# Patient Record
Sex: Female | Born: 1985 | Race: White | Hispanic: No | Marital: Single | Smoking: Former smoker
Health system: Southern US, Community
[De-identification: ages and names within clinical notes are randomized; demographics above are authoritative.]

## PROBLEM LIST (undated history)

## (undated) DIAGNOSIS — M5090 Cervical disc disorder, unspecified, unspecified cervical region: Secondary | ICD-10-CM

## (undated) DIAGNOSIS — G43909 Migraine, unspecified, not intractable, without status migrainosus: Secondary | ICD-10-CM

## (undated) HISTORY — DX: Cervical disc disorder, unspecified, unspecified cervical region: M50.90

## (undated) HISTORY — DX: Migraine, unspecified, not intractable, without status migrainosus: G43.909

## (undated) HISTORY — PX: BACK SURGERY: SHX140

---

## 2005-04-23 ENCOUNTER — Emergency Department: Payer: Self-pay | Admitting: Emergency Medicine

## 2005-08-07 ENCOUNTER — Emergency Department: Payer: Self-pay | Admitting: General Practice

## 2005-09-21 ENCOUNTER — Emergency Department: Payer: Self-pay | Admitting: Unknown Physician Specialty

## 2005-11-26 ENCOUNTER — Emergency Department: Payer: Self-pay | Admitting: Unknown Physician Specialty

## 2005-12-07 ENCOUNTER — Emergency Department: Payer: Self-pay | Admitting: Emergency Medicine

## 2006-03-13 ENCOUNTER — Emergency Department: Payer: Self-pay | Admitting: Emergency Medicine

## 2006-06-06 ENCOUNTER — Emergency Department: Payer: Self-pay | Admitting: Emergency Medicine

## 2006-07-31 ENCOUNTER — Emergency Department: Payer: Self-pay | Admitting: Emergency Medicine

## 2006-09-09 ENCOUNTER — Emergency Department: Payer: Self-pay | Admitting: Emergency Medicine

## 2007-04-17 ENCOUNTER — Emergency Department: Payer: Self-pay | Admitting: Emergency Medicine

## 2007-05-09 ENCOUNTER — Emergency Department: Payer: Self-pay | Admitting: Emergency Medicine

## 2007-05-30 ENCOUNTER — Emergency Department: Payer: Self-pay | Admitting: Emergency Medicine

## 2007-07-06 ENCOUNTER — Emergency Department: Payer: Self-pay | Admitting: Emergency Medicine

## 2007-07-30 ENCOUNTER — Emergency Department: Payer: Self-pay | Admitting: Emergency Medicine

## 2007-10-17 ENCOUNTER — Emergency Department: Payer: Self-pay

## 2007-11-11 ENCOUNTER — Emergency Department: Payer: Self-pay | Admitting: Internal Medicine

## 2008-01-09 ENCOUNTER — Emergency Department: Payer: Self-pay | Admitting: Emergency Medicine

## 2008-02-10 ENCOUNTER — Emergency Department: Payer: Self-pay | Admitting: Emergency Medicine

## 2008-02-16 ENCOUNTER — Emergency Department: Payer: Self-pay | Admitting: Emergency Medicine

## 2008-03-23 ENCOUNTER — Emergency Department: Payer: Self-pay | Admitting: Emergency Medicine

## 2008-03-27 ENCOUNTER — Emergency Department: Payer: Self-pay | Admitting: Emergency Medicine

## 2008-04-29 ENCOUNTER — Emergency Department: Payer: Self-pay | Admitting: Internal Medicine

## 2008-07-20 ENCOUNTER — Emergency Department: Payer: Self-pay | Admitting: Internal Medicine

## 2008-09-15 ENCOUNTER — Emergency Department: Payer: Self-pay | Admitting: Unknown Physician Specialty

## 2008-11-02 ENCOUNTER — Emergency Department: Payer: Self-pay | Admitting: Unknown Physician Specialty

## 2008-12-15 ENCOUNTER — Emergency Department: Payer: Self-pay | Admitting: Emergency Medicine

## 2009-02-11 ENCOUNTER — Emergency Department: Payer: Self-pay | Admitting: Emergency Medicine

## 2009-03-14 IMAGING — CR RIGHT FOOT COMPLETE - 3+ VIEW
1 series · 3 of 3 positions shown · non-contrast
Comparison: None

REASON FOR EXAM: rt foot pain/injury
COMMENTS:

PROCEDURE:     DXR - DXR FOOT RT COMPLETE W/OBLIQUES  - January 09, 2008 [DATE]
RESULT:     History: Pain

[Series 1: view not recorded · 0.17mm/px · 3 of 3 slices shown]
[im 1/3]
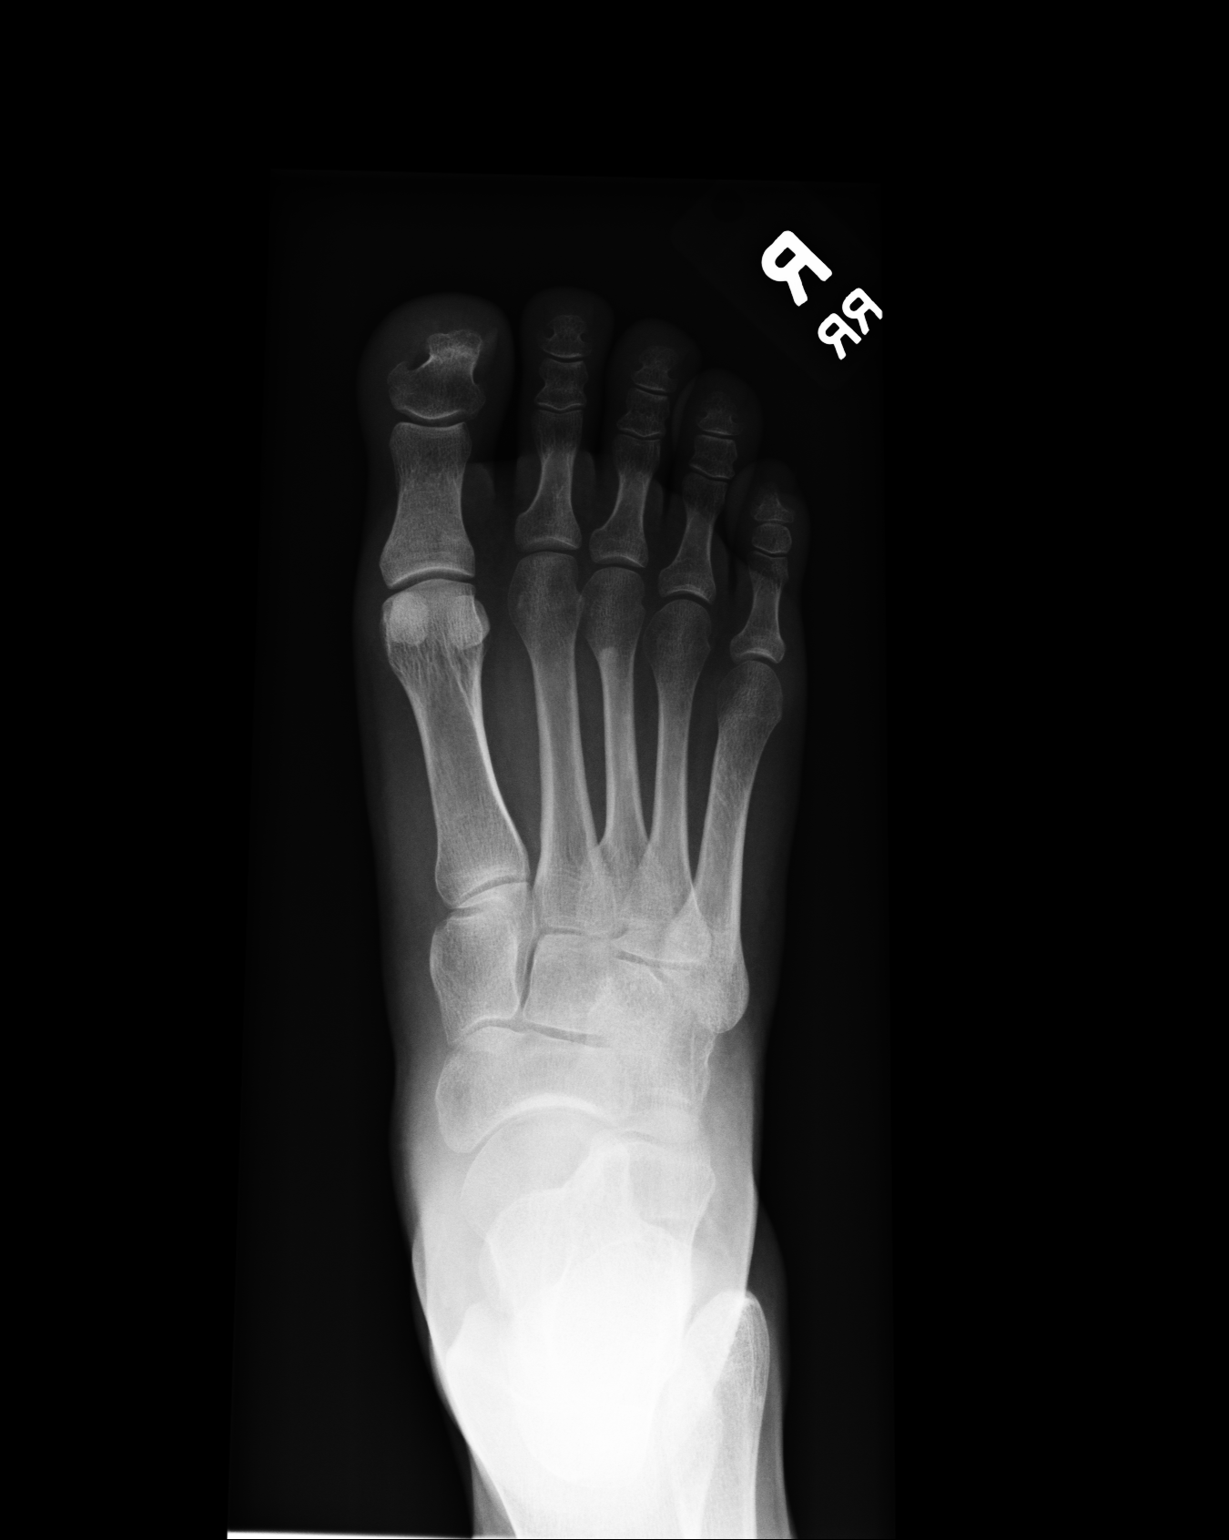
[im 2/3]
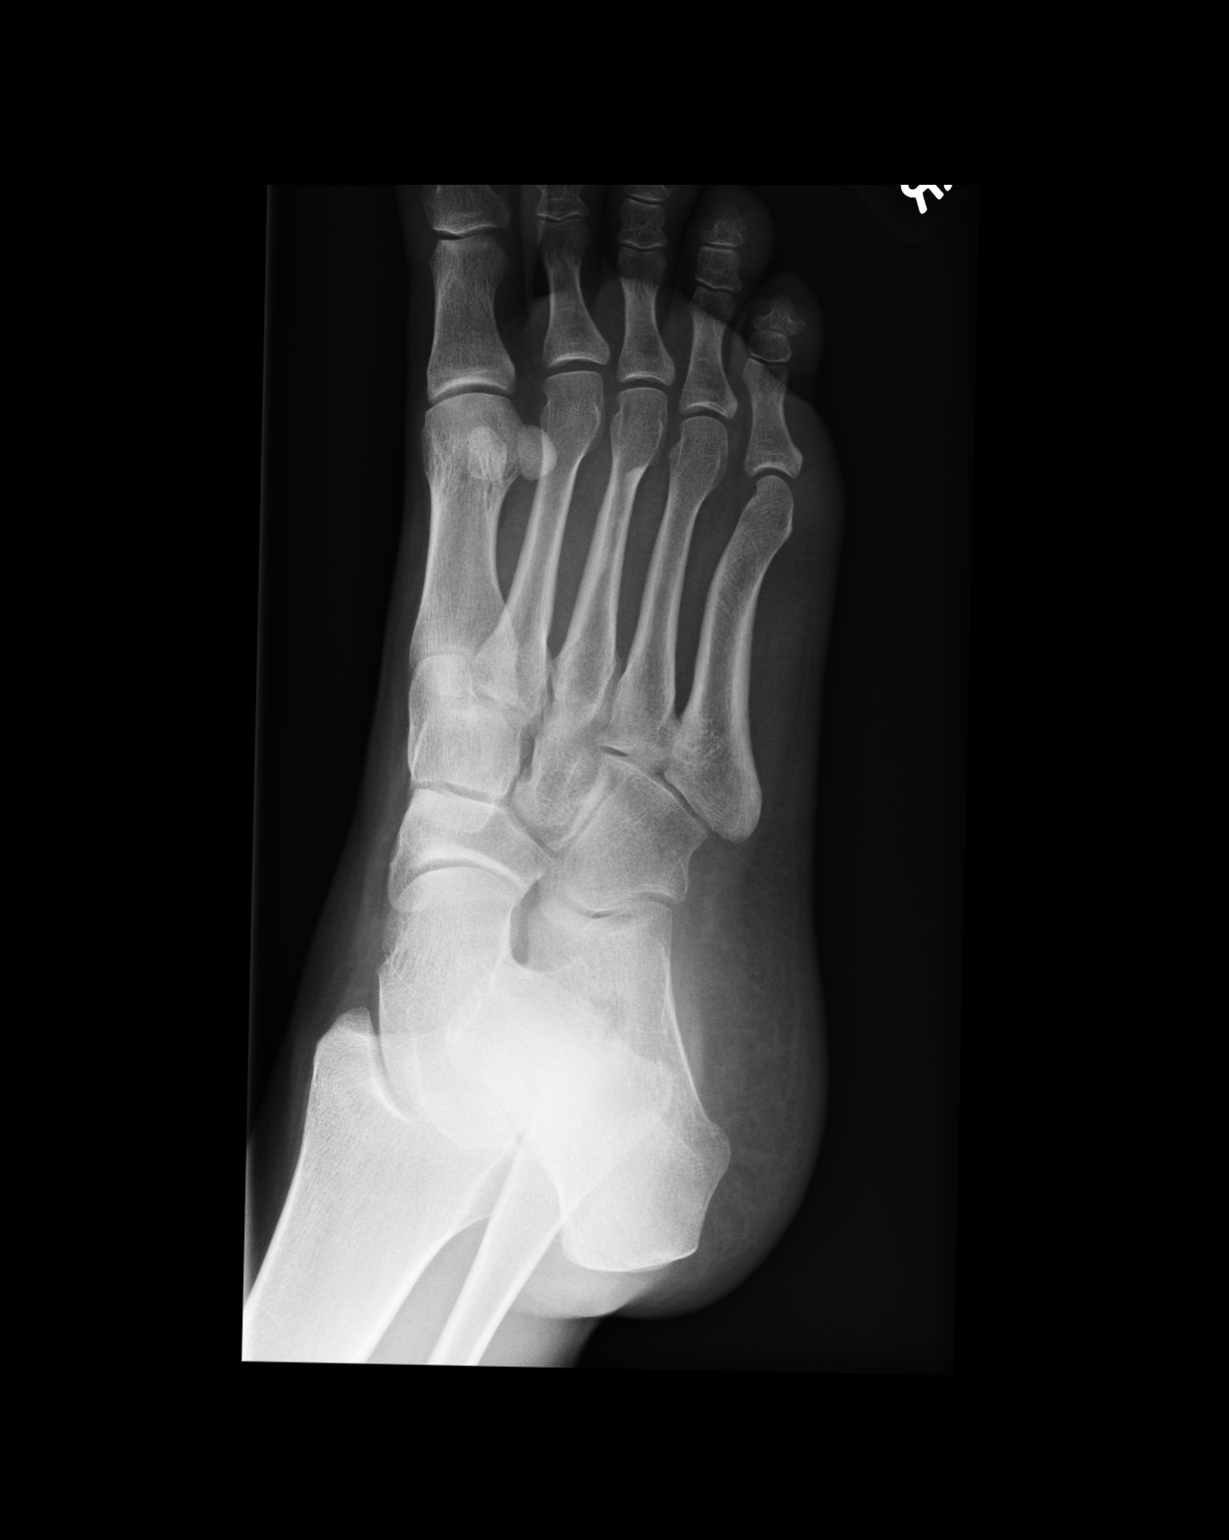
[im 3/3]
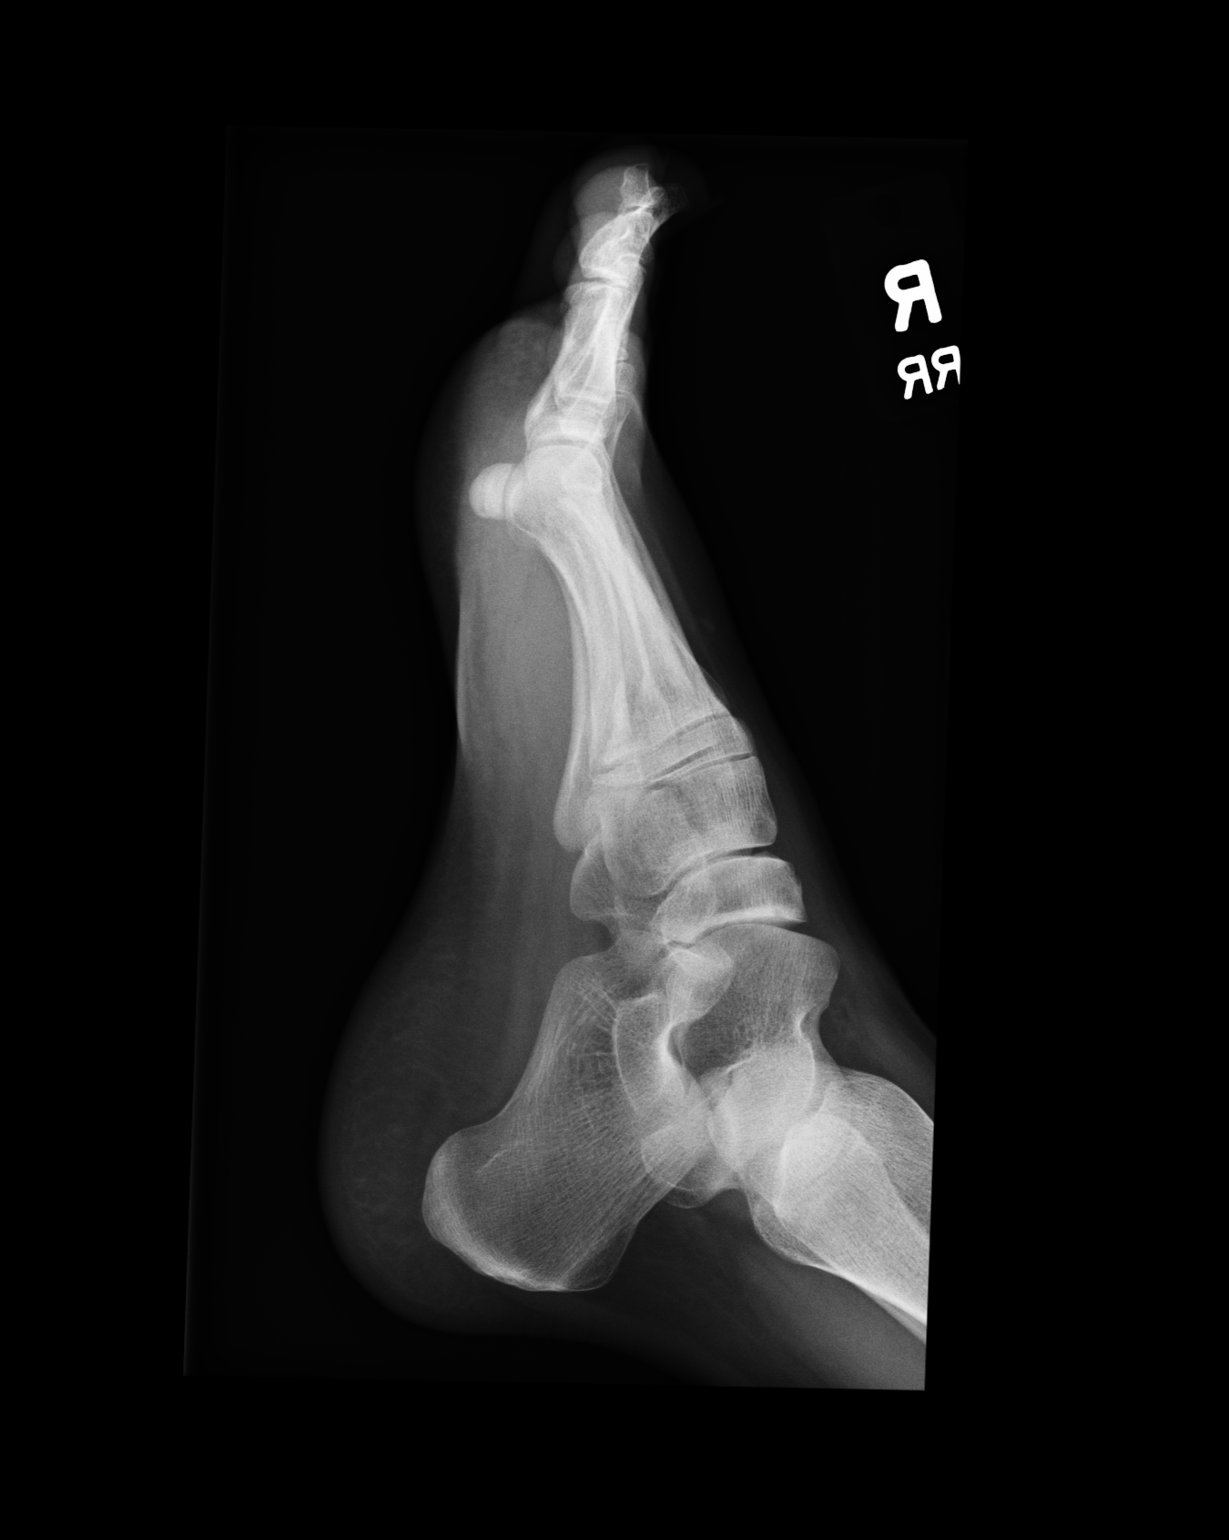

[3 of 3 positions shown; findings below may reference images not displayed]

FINDINGS: AP, oblique, and lateral views of the right foot demonstrate no fracture or
dislocation. There is no soft tissue abnormality. There is no subcutaneous
emphysema or radiopaque foreign bodies.
IMPRESSION: No acute osseous injury of the right foot.

## 2009-03-16 ENCOUNTER — Emergency Department: Payer: Self-pay | Admitting: Emergency Medicine

## 2009-04-27 ENCOUNTER — Emergency Department: Payer: Self-pay | Admitting: Internal Medicine

## 2009-05-30 ENCOUNTER — Emergency Department: Payer: Self-pay | Admitting: Emergency Medicine

## 2009-09-28 ENCOUNTER — Emergency Department: Payer: Self-pay | Admitting: Internal Medicine

## 2009-10-23 ENCOUNTER — Emergency Department: Payer: Self-pay | Admitting: Emergency Medicine

## 2009-11-11 ENCOUNTER — Emergency Department: Payer: Self-pay | Admitting: Emergency Medicine

## 2009-12-13 ENCOUNTER — Emergency Department: Payer: Self-pay | Admitting: Emergency Medicine

## 2010-04-04 ENCOUNTER — Emergency Department: Payer: Self-pay | Admitting: Emergency Medicine

## 2010-05-19 ENCOUNTER — Emergency Department: Payer: Self-pay | Admitting: Emergency Medicine

## 2011-03-01 ENCOUNTER — Emergency Department: Payer: Self-pay | Admitting: Emergency Medicine

## 2011-09-21 ENCOUNTER — Emergency Department: Payer: Self-pay | Admitting: Emergency Medicine

## 2012-04-22 ENCOUNTER — Emergency Department: Payer: Self-pay

## 2012-04-24 LAB — BETA STREP CULTURE(ARMC)

## 2012-11-25 ENCOUNTER — Emergency Department: Payer: Self-pay | Admitting: Emergency Medicine

## 2013-03-05 ENCOUNTER — Emergency Department: Payer: Self-pay | Admitting: Emergency Medicine

## 2013-07-21 ENCOUNTER — Emergency Department: Payer: Self-pay | Admitting: Emergency Medicine

## 2014-01-23 ENCOUNTER — Emergency Department: Payer: Self-pay | Admitting: Student

## 2014-05-05 ENCOUNTER — Emergency Department: Payer: Self-pay | Admitting: Emergency Medicine

## 2014-10-09 ENCOUNTER — Encounter: Payer: Self-pay | Admitting: Emergency Medicine

## 2014-10-09 ENCOUNTER — Emergency Department
Admission: EM | Admit: 2014-10-09 | Discharge: 2014-10-09 | Disposition: A | Discharge status: Home / Self Care | Payer: BLUE CROSS/BLUE SHIELD | Attending: Emergency Medicine | Admitting: Emergency Medicine

## 2014-10-09 ENCOUNTER — Emergency Department: Payer: BLUE CROSS/BLUE SHIELD

## 2014-10-09 DIAGNOSIS — Y9389 Activity, other specified: Secondary | ICD-10-CM | POA: Insufficient documentation

## 2014-10-09 DIAGNOSIS — Y9289 Other specified places as the place of occurrence of the external cause: Secondary | ICD-10-CM | POA: Diagnosis not present

## 2014-10-09 DIAGNOSIS — Y998 Other external cause status: Secondary | ICD-10-CM | POA: Diagnosis not present

## 2014-10-09 DIAGNOSIS — S86811A Strain of other muscle(s) and tendon(s) at lower leg level, right leg, initial encounter: Secondary | ICD-10-CM | POA: Diagnosis not present

## 2014-10-09 DIAGNOSIS — S86911A Strain of unspecified muscle(s) and tendon(s) at lower leg level, right leg, initial encounter: Secondary | ICD-10-CM

## 2014-10-09 DIAGNOSIS — W4909XA Other specified item causing external constriction, initial encounter: Secondary | ICD-10-CM | POA: Diagnosis not present

## 2014-10-09 DIAGNOSIS — S80911A Unspecified superficial injury of right knee, initial encounter: Secondary | ICD-10-CM | POA: Diagnosis present

## 2014-10-09 MED ORDER — KETOROLAC TROMETHAMINE 60 MG/2ML IM SOLN
60.0000 mg | Freq: Once | INTRAMUSCULAR | Status: AC
  Administered 2014-10-09: 60 mg via INTRAMUSCULAR
  Filled 2014-10-09: qty 2

## 2014-10-09 MED ORDER — IBUPROFEN 800 MG PO TABS: 800.0000 mg | ORAL_TABLET | Freq: Three times a day (TID) | ORAL | Status: DC | PRN

## 2014-10-09 MED ORDER — HYDROCODONE-ACETAMINOPHEN 5-325 MG PO TABS: 1.0000 | ORAL_TABLET | ORAL | Status: DC | PRN

## 2014-10-09 MED ORDER — PREDNISONE 10 MG PO TABS: 50.0000 mg | ORAL_TABLET | Freq: Every day | ORAL | Status: DC

## 2014-10-09 NOTE — ED Provider Notes (Signed)
[facility] Emergency Department Provider Note  ____________________________________________  Time seen: Approximately 12:18 PM  I have reviewed the triage vital signs and the nursing notes.   HISTORY  Chief Complaint Knee Pain    HPI Rose Ray is a 29 y.o. 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 Denies any specific trauma to her knee.   History reviewed. No pertinent past medical history.  There are no active problems to display for this patient.   History reviewed. No pertinent past surgical history.  Current Outpatient Rx  Name  Route  Sig  Dispense  Refill  . HYDROcodone-acetaminophen (NORCO) 5-325 MG per tablet   Oral   Take 1-2 tablets by mouth every 4 (four) hours as needed for moderate pain.   8 tablet   0   . ibuprofen (ADVIL,MOTRIN) 800 MG tablet   Oral   Take 1 tablet (800 mg total) by mouth every 8 (eight) hours as needed.   30 tablet   0   . predniSONE (DELTASONE) 10 MG tablet   Oral   Take 5 tablets (50 mg total) by mouth daily with breakfast.   25 tablet   0     Allergies Review of patient's allergies indicates no known allergies.  History reviewed. No pertinent family history.  Social History Social History  Substance Use Topics  . Smoking status: Never Smoker   . Smokeless tobacco: None  . Alcohol Use: No    Review of Systems Constitutional: No fever/chills Eyes: No visual changes. ENT: No sore throat. Cardiovascular: Denies chest pain. Respiratory: Denies shortness of breath. Gastrointestinal: No abdominal pain.  No nausea, no vomiting.  No diarrhea.  No constipation. Genitourinary: Negative for dysuria. Musculoskeletal: Positive for right knee pain with no known trauma. Skin: Negative for rash. Neurological: Negative for headaches, focal weakness or numbness.  10-point ROS otherwise  negative.  ____________________________________________   PHYSICAL EXAM:  VITAL SIGNS: ED Triage Vitals  Enc Vitals Group     BP 10/09/14 1152 120/79 mmHg     Pulse Rate 10/09/14 1152 72     Resp 10/09/14 1152 20     Temp 10/09/14 1152 98 F (36.7 C)     Temp Source 10/09/14 1152 Oral     SpO2 10/09/14 1152 97 %     Weight 10/09/14 1152 155 lb (70.308 kg)     Height 10/09/14 1152 5\' 5"  (1.651 m)     Head Cir --      Peak Flow --      Pain Score 10/09/14 1155 10     Pain Loc --      Pain Edu? --      Excl. in GC? --     Constitutional: Alert and oriented. Well appearing and in no acute distress. Musculoskeletal: Positive right knee pain with no joint effusion noted. Increased pain with flexion and extension. Negative for valgus stress test. Neurologic:  Normal speech and language. No gross focal neurologic deficits are appreciated. No gait instability. Skin:  Skin is warm, dry and intact. No rash noted. Psychiatric: Mood and affect are normal. Speech and behavior are normal.  ____________________________________________   LABS (all labs ordered are listed, but only abnormal results are displayed)  Labs Reviewed - No data to display ____________________________________________ RADIOLOGY  Right knee x-ray interpreted by radiologist reviewed by myself as negative. ____________________________________________   PROCEDURES  Procedure(s)  performed: None  Critical Care performed: No  ____________________________________________   INITIAL IMPRESSION / ASSESSMENT AND PLAN / ED COURSE  Pertinent labs & imaging results that were available during my care of the patient were reviewed by me and considered in my medical decision making (see chart for details).  Right knee strain. 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 Reassurance provided encouraged patient to find some salty source. Rx given for Motrin 800 mg 3 times a day #30, prednisone 50 mg daily 5 days and Norco  06/28/2023 number 8 provided as well. Patient to follow-up with orthopedics if continued pain. She voices no other emergency medical complaints at this time. ____________________________________________   FINAL CLINICAL IMPRESSION(S) / ED DIAGNOSES  Final diagnoses:  Knee strain, right, initial encounter      Evangeline Dakin, PA-C 10/09/14 1324  Loleta Rose, MD 10/09/14 1715

## 2014-10-09 NOTE — ED Notes (Signed)
Discussed prescriptions and discharge instructions with the patient and all questions were fully answered.

## 2014-10-09 NOTE — ED Notes (Signed)
Patient to ED with c/o left knee pain x1 week, denies any known injury.

## 2014-10-09 NOTE — Discharge Instructions (Signed)
Joint Sprain A sprain is a tear or stretch in the ligaments that hold a joint together. Severe sprains may need as long as 3-6 weeks of immobilization and/or exercises to heal completely. Sprained joints should be rested and protected. If not, they can become unstable and prone to re-injury. Proper treatment can reduce your pain, shorten the period of disability, and reduce the risk of repeated injuries. TREATMENT   Rest and elevate the injured joint to reduce pain and swelling.  Apply ice packs to the injury for 20-30 minutes every 2-3 hours for the next 2-3 days.  Keep the injury wrapped in a compression bandage or splint as long as the joint is painful or as instructed by your caregiver.  Do not use the injured joint until it is completely healed to prevent re-injury and chronic instability. Follow the instructions of your caregiver.  Long-term sprain management may require exercises and/or treatment by a physical therapist. Taping or special braces may help stabilize the joint until it is completely better. SEEK MEDICAL CARE IF:   You develop increased pain or swelling of the joint.  You develop increasing redness and warmth of the joint.  You develop a fever.  It becomes stiff.  Your hand or foot gets cold or numb. Document Released: 03/21/2004 Document Revised: 05/06/2011 Document Reviewed: 02/29/2008 [facility] Patient Information 2015 [place], [place]. This information is not intended to replace advice given to you by your health care provider. Make sure you discuss any questions you have with your health care provider.  Knee Pain The knee is the complex joint between your thigh and your lower leg. It is made up of bones, tendons, ligaments, and cartilage. The bones that make up the knee are:  The femur in the thigh.  The tibia and fibula in the lower leg.  The patella or kneecap riding in the groove on the lower femur. CAUSES  Knee pain is a common complaint with many  causes. A few of these causes are:  Injury, such as:  A ruptured ligament or tendon injury.  Torn cartilage.  Medical conditions, such as:  Gout  Arthritis  Infections  Overuse, over training, or overdoing a physical activity. Knee pain can be minor or severe. Knee pain can accompany debilitating injury. Minor knee problems often respond well to self-care measures or get well on their own. More serious injuries may need medical intervention or even surgery. SYMPTOMS The knee is complex. Symptoms of knee problems can vary widely. Some of the problems are:  Pain with movement and weight bearing.  Swelling and tenderness.  Buckling of the knee.  Inability to straighten or extend your knee.  Your knee locks and you cannot straighten it.  Warmth and redness with pain and fever.  Deformity or dislocation of the kneecap. DIAGNOSIS  Determining what is wrong may be very straight forward such as when there is an injury. It can also be challenging because of the complexity of the knee. Tests to make a diagnosis may include:  Your caregiver taking a history and doing a physical exam.  Routine X-rays can be used to rule out other problems. X-rays will not reveal a cartilage tear. Some injuries of the knee can be diagnosed by:  Arthroscopy a surgical technique by which a small video camera is inserted through tiny incisions on the sides of the knee. This procedure is used to examine and repair internal knee joint problems. Tiny instruments can be used during arthroscopy to repair the torn knee  cartilage (meniscus).  Arthrography is a radiology technique. A contrast liquid is directly injected into the knee joint. Internal structures of the knee joint then become visible on X-ray film.  An MRI scan is a non X-ray radiology procedure in which magnetic fields and a computer produce two- or three-dimensional images of the inside of the knee. Cartilage tears are often visible using an MRI  scanner. MRI scans have largely replaced arthrography in diagnosing cartilage tears of the knee.  Blood work.  Examination of the fluid that helps to lubricate the knee joint (synovial fluid). This is done by taking a sample out using a needle and a syringe. TREATMENT The treatment of knee problems depends on the cause. Some of these treatments are:  Depending on the injury, proper casting, splinting, surgery, or physical therapy care will be needed.  Give yourself adequate recovery time. Do not overuse your joints. If you begin to get sore during workout routines, back off. Slow down or do fewer repetitions.  For repetitive activities such as cycling or running, maintain your strength and nutrition.  Alternate muscle groups. For example, if you are a weight lifter, work the upper body on one day and the lower body the next.  Either tight or weak muscles do not give the proper support for your knee. Tight or weak muscles do not absorb the stress placed on the knee joint. Keep the muscles surrounding the knee strong.  Take care of mechanical problems.  If you have flat feet, orthotics or special shoes may help. See your caregiver if you need help.  Arch supports, sometimes with wedges on the inner or outer aspect of the heel, can help. These can shift pressure away from the side of the knee most bothered by osteoarthritis.  A brace called an "unloader" brace also may be used to help ease the pressure on the most arthritic side of the knee.  If your caregiver has prescribed crutches, braces, wraps or ice, use as directed. The acronym for this is PRICE. This means protection, rest, ice, compression, and elevation.  Nonsteroidal anti-inflammatory drugs (NSAIDs), can help relieve pain. But if taken immediately after an injury, they may actually increase swelling. Take NSAIDs with food in your stomach. Stop them if you develop stomach problems. Do not take these if you have a history of ulcers,  stomach pain, or bleeding from the bowel. Do not take without your caregiver's approval if you have problems with fluid retention, heart failure, or kidney problems.  For ongoing knee problems, physical therapy may be helpful.  Glucosamine and chondroitin are over-the-counter dietary supplements. Both may help relieve the pain of osteoarthritis in the knee. These medicines are different from the usual anti-inflammatory drugs. Glucosamine may decrease the rate of cartilage destruction.  Injections of a corticosteroid drug into your knee joint may help reduce the symptoms of an arthritis flare-up. They may provide pain relief that lasts a few months. You may have to wait a few months between injections. The injections do have a small increased risk of infection, water retention, and elevated blood sugar levels.  Hyaluronic acid injected into damaged joints may ease pain and provide lubrication. These injections may work by reducing inflammation. A series of shots may give relief for as long as 6 months.  Topical painkillers. Applying certain ointments to your skin may help relieve the pain and stiffness of osteoarthritis. Ask your pharmacist for suggestions. Many over the-counter products are approved for temporary relief of arthritis pain.  In  some countries, doctors often prescribe topical NSAIDs for relief of chronic conditions such as arthritis and tendinitis. A review of treatment with NSAID creams found that they worked as well as oral medications but without the serious side effects. PREVENTION  Maintain a healthy weight. Extra pounds put more strain on your joints.  Get strong, stay limber. Weak muscles are a common cause of knee injuries. Stretching is important. Include flexibility exercises in your workouts.  Be smart about exercise. If you have osteoarthritis, chronic knee pain or recurring injuries, you may need to change the way you exercise. This does not mean you have to stop being  active. If your knees ache after jogging or playing basketball, consider switching to swimming, water aerobics, or other low-impact activities, at least for a few days a week. Sometimes limiting high-impact activities will provide relief.  Make sure your shoes fit well. Choose footwear that is right for your sport.  Protect your knees. Use the proper gear for knee-sensitive activities. Use kneepads when playing volleyball or laying carpet. Buckle your seat belt every time you drive. Most shattered kneecaps occur in car accidents.  Rest when you are tired. SEEK MEDICAL CARE IF:  You have knee pain that is continual and does not seem to be getting better.  SEEK IMMEDIATE MEDICAL CARE IF:  Your knee joint feels hot to the touch and you have a high fever. MAKE SURE YOU:   Understand these instructions.  Will watch your condition.  Will get help right away if you are not doing well or get worse. Document Released: 12/09/2006 Document Revised: 05/06/2011 Document Reviewed: 12/09/2006 [facility] Patient Information 2015 [place], [place]. This information is not intended to replace advice given to you by your health care provider. Make sure you discuss any questions you have with your health care provider.

## 2015-01-04 ENCOUNTER — Emergency Department
Admission: EM | Admit: 2015-01-04 | Discharge: 2015-01-04 | Disposition: A | Discharge status: Home / Self Care | Payer: BLUE CROSS/BLUE SHIELD | Attending: Emergency Medicine | Admitting: Emergency Medicine

## 2015-01-04 ENCOUNTER — Encounter: Payer: Self-pay | Admitting: Emergency Medicine

## 2015-01-04 DIAGNOSIS — Z7952 Long term (current) use of systemic steroids: Secondary | ICD-10-CM | POA: Diagnosis not present

## 2015-01-04 DIAGNOSIS — J0100 Acute maxillary sinusitis, unspecified: Secondary | ICD-10-CM | POA: Diagnosis not present

## 2015-01-04 DIAGNOSIS — R51 Headache: Secondary | ICD-10-CM | POA: Diagnosis present

## 2015-01-04 MED ORDER — GUAIFENESIN ER 600 MG PO TB12: 600.0000 mg | ORAL_TABLET | Freq: Two times a day (BID) | ORAL | Status: AC

## 2015-01-04 MED ORDER — AZITHROMYCIN 250 MG PO TABS: ORAL_TABLET | ORAL | Status: DC

## 2015-01-04 MED ORDER — IBUPROFEN 800 MG PO TABS
800.0000 mg | ORAL_TABLET | Freq: Once | ORAL | Status: AC
  Administered 2015-01-04: 800 mg via ORAL
  Filled 2015-01-04: qty 1

## 2015-01-04 MED ORDER — AZITHROMYCIN 250 MG PO TABS
500.0000 mg | ORAL_TABLET | Freq: Once | ORAL | Status: AC
  Administered 2015-01-04: 500 mg via ORAL
  Filled 2015-01-04: qty 2

## 2015-01-04 NOTE — Discharge Instructions (Signed)

## 2015-01-04 NOTE — ED Notes (Signed)
Having some sinus pressure

## 2015-01-04 NOTE — ED Provider Notes (Signed)
[facility] Emergency Department Provider Note  ____________________________________________  Time seen: Approximately 6:51 PM  I have reviewed the triage vital signs and the nursing notes.   HISTORY  Chief Complaint Facial Pain   HPI Rose Ray is a 29 y.o. female presents for evaluation of right maxillary sinus pressure. Patient states sudden onset today complains of increased pain hurts to close her eyes. Visual changes denies any runny nose cough congestion   History reviewed. No pertinent past medical history.  There are no active problems to display for this patient.   History reviewed. No pertinent past surgical history.  Current Outpatient Rx  Name  Route  Sig  Dispense  Refill  . azithromycin (ZITHROMAX Z-PAK) 250 MG tablet      Take 2 tablets (500 mg) daily x 3 days.   6 each   0   . guaiFENesin (MUCINEX) 600 MG 12 hr tablet   Oral   Take 1 tablet (600 mg total) by mouth 2 (two) times daily.   30 tablet   0     Allergies Review of patient's allergies indicates no known allergies.  No family history on file.  Social History Social History  Substance Use Topics  . Smoking status: Never Smoker   . Smokeless tobacco: None  . Alcohol Use: No    Review of Systems Constitutional: No fever/chills Eyes: No visual changes. ENT: No sore throat. Positive right maxillary tenderness. Cardiovascular: Denies chest pain. Respiratory: Denies shortness of breath. Gastrointestinal: No abdominal pain.  No nausea, no vomiting.  No diarrhea.  No constipation. Genitourinary: Negative for dysuria. Musculoskeletal: Negative for back pain. Skin: Negative for rash. Neurological: Negative for headaches, focal weakness or numbness.  10-point ROS otherwise negative.  ____________________________________________   PHYSICAL EXAM: BP 110/79 mmHg  Pulse 85  Temp(Src) 98.2 F (36.8 C) (Oral)  Resp 18  SpO2 96%  VITAL SIGNS: ED Triage  Vitals  Enc Vitals Group     BP --      Pulse --      Resp --      Temp --      Temp src --      SpO2 --      Weight --      Height --      Head Cir --      Peak Flow --      Pain Score 01/04/15 1840 5     Pain Loc --      Pain Edu? --      Excl. in GC? --     Constitutional: Alert and oriented. Well appearing and in no acute distress. Eyes: Conjunctivae are normal. PERRL. EOMI. Head: Atraumatic. Nose: No congestion/rhinnorhea. Mouth/Throat: Mucous membranes are moist.  Oropharynx non-erythematous. Neck: No stridor.   Cardiovascular: Normal rate, regular rhythm. Grossly normal heart sounds.  Good peripheral circulation. Respiratory: Normal respiratory effort.  No retractions. Lungs CTAB. Gastrointestinal: Soft and nontender. No distention. No abdominal bruits. No CVA tenderness. Musculoskeletal: No lower extremity tenderness nor edema.  No joint effusions. Neurologic:  Normal speech and language. No gross focal neurologic deficits are appreciated. No gait instability. Skin:  Skin is warm, dry and intact. No rash noted. Psychiatric: Mood and affect are normal. Speech and behavior are normal.  ____________________________________________   LABS (all labs ordered are listed, but only abnormal results are displayed)  Labs Reviewed - No data to display ____________________________________________   PROCEDURES  Procedure(s) performed: None  Critical Care performed: No  ____________________________________________   INITIAL IMPRESSION / ASSESSMENT AND PLAN / ED COURSE  Pertinent labs & imaging results that were available during my care of the patient were reviewed by me and considered in my medical decision making (see chart for details).  Acute maxillary sinusitis. Rx given for Zithromax 1 g daily 3 days. Motrin 800 mg 3 times a day. She'll follow up PCP or return to the ER with any worsening symptomology. Patient voices no other emergency medical complaints at this  visit. ____________________________________________   FINAL CLINICAL IMPRESSION(S) / ED DIAGNOSES  Final diagnoses:  Acute maxillary sinusitis, recurrence not specified      Evangeline Dakinharles M Beers, PA-C 01/04/15 1901  Evangeline Dakinharles M Beers, PA-C 01/04/15 1901  Phineas SemenGraydon Goodman, MD 01/04/15 1926

## 2015-03-28 ENCOUNTER — Emergency Department
Admission: EM | Admit: 2015-03-28 | Discharge: 2015-03-28 | Disposition: A | Discharge status: Home / Self Care | Payer: BLUE CROSS/BLUE SHIELD | Attending: Emergency Medicine | Admitting: Emergency Medicine

## 2015-03-28 ENCOUNTER — Encounter: Payer: Self-pay | Admitting: Emergency Medicine

## 2015-03-28 DIAGNOSIS — Z79899 Other long term (current) drug therapy: Secondary | ICD-10-CM | POA: Insufficient documentation

## 2015-03-28 DIAGNOSIS — Z792 Long term (current) use of antibiotics: Secondary | ICD-10-CM | POA: Diagnosis not present

## 2015-03-28 DIAGNOSIS — S99921A Unspecified injury of right foot, initial encounter: Secondary | ICD-10-CM | POA: Diagnosis present

## 2015-03-28 DIAGNOSIS — S93601A Unspecified sprain of right foot, initial encounter: Secondary | ICD-10-CM | POA: Diagnosis not present

## 2015-03-28 DIAGNOSIS — X58XXXA Exposure to other specified factors, initial encounter: Secondary | ICD-10-CM | POA: Insufficient documentation

## 2015-03-28 DIAGNOSIS — Y9389 Activity, other specified: Secondary | ICD-10-CM | POA: Insufficient documentation

## 2015-03-28 DIAGNOSIS — Z880 Allergy status to penicillin: Secondary | ICD-10-CM | POA: Diagnosis not present

## 2015-03-28 DIAGNOSIS — Y998 Other external cause status: Secondary | ICD-10-CM | POA: Diagnosis not present

## 2015-03-28 DIAGNOSIS — Y9289 Other specified places as the place of occurrence of the external cause: Secondary | ICD-10-CM | POA: Diagnosis not present

## 2015-03-28 MED ORDER — TRAMADOL HCL 50 MG PO TABS: 50.0000 mg | ORAL_TABLET | Freq: Two times a day (BID) | ORAL | Status: DC

## 2015-03-28 NOTE — ED Provider Notes (Signed)
[facility] Emergency Department Provider Note ____________________________________________  Time seen: 1730  I have reviewed the triage vital signs and the nursing notes.  HISTORY  Chief Complaint  Foot Pain  HPI Rose Ray is a 30 y.o. female present to the ED for evaluation of dorsal right foot pain for the last 24 hours. She denies any injury, trauma, contusion, or abrasion. She does admit to a new exercise program that she began last week. The pain as dorsal radiating from the base of the second and third toes of the midfoot. She describes the pain as sharp, and worse with walking. She has applied ice for swelling which is now resolved and has dosed Aleve with some benefit.  History reviewed. No pertinent past medical history.  There are no active problems to display for this patient.  History reviewed. No pertinent past surgical history.  Current Outpatient Rx  Name  Route  Sig  Dispense  Refill  . azithromycin (ZITHROMAX Z-PAK) 250 MG tablet      Take 2 tablets (500 mg) daily x 3 days.   6 each   0   . guaiFENesin (MUCINEX) 600 MG 12 hr tablet   Oral   Take 1 tablet (600 mg total) by mouth 2 (two) times daily.   30 tablet   0   . traMADol (ULTRAM) 50 MG tablet   Oral   Take 1 tablet (50 mg total) by mouth 2 (two) times daily.   10 tablet   0    Allergies Amoxicillin  History reviewed. No pertinent family history.  Social History Social History  Substance Use Topics  . Smoking status: Never Smoker   . Smokeless tobacco: None  . Alcohol Use: No   Review of Systems  Constitutional: Negative for fever. Eyes: Negative for visual changes. ENT: Negative for sore throat. Cardiovascular: Negative for chest pain. Respiratory: Negative for shortness of breath. Gastrointestinal: Negative for abdominal pain, vomiting and diarrhea. Genitourinary: Negative for dysuria. Musculoskeletal: Negative for back pain. Right foot pain as  above Skin: Negative for rash. Neurological: Negative for headaches, focal weakness or numbness. ____________________________________________  PHYSICAL EXAM:  VITAL SIGNS: ED Triage Vitals  Enc Vitals Group     BP 03/28/15 1641 131/72 mmHg     Pulse Rate 03/28/15 1641 69     Resp 03/28/15 1641 18     Temp 03/28/15 1641 97.5 F (36.4 C)     Temp Source 03/28/15 1641 Oral     SpO2 03/28/15 1641 98 %     Weight 03/28/15 1641 161 lb (73.029 kg)     Height 03/28/15 1641  (1.651 m)     Head Cir --      Peak Flow --      Pain Score 03/28/15 1639 10     Pain Loc --      Pain Edu? --      Excl. in GC? --    Constitutional: Alert and oriented. Well appearing and in no distress. Head: Normocephalic and atraumatic.      Eyes: Conjunctivae are normal. PERRL. Normal extraocular movements      Ears: Canals clear. TMs intact bilaterally.   Nose: No congestion/rhinorrhea.   Mouth/Throat: Mucous membranes are moist.   Neck: Supple. No thyromegaly. Hematological/Lymphatic/Immunological: No cervical lymphadenopathy. Cardiovascular: Normal rate, regular rhythm. Normal DP and PT pulses. Respiratory: Normal respiratory effort. No wheezes/rales/rhonchi. Gastrointestinal: Soft and nontender. No distention. Musculoskeletal: Right foot without any obvious deformity, abrasion, edema, or erythema.  Patient localizes tenderness to the dorsal aspect of the foot over the second and third extensor tendons. Normal ankle exam without deficit. Nontender with normal range of motion in all other extremities.  Neurologic:  Normal gait without ataxia. Normal speech and language. No gross focal neurologic deficits are appreciated. Skin:  Skin is warm, dry and intact. No rash noted. Psychiatric: Mood and affect are normal. Patient exhibits appropriate insight and judgment. ____________________________________________   RADIOLOGY  deferred ____________________________________________  INITIAL  IMPRESSION / ASSESSMENT AND PLAN / ED COURSE  Patient with an acute right foot sprain and tinnitus. She was discharged with a prescription for Ultram to dose in addition to her daily Aleve. She is also encouraged to wear a rigid sole or platform style shoe. She will be provided with a work note from work 1 day and follow-up with Dr. Lucile Crater for ongoing symptoms. ____________________________________________  FINAL CLINICAL IMPRESSION(S) / ED DIAGNOSES  Final diagnoses:  Right foot sprain, initial encounter      Lissa Hoard, PA-C 03/28/15 1759  Myrna Blazer, MD 03/28/15 2351

## 2015-03-28 NOTE — Discharge Instructions (Signed)
Foot Sprain °A foot sprain is an injury to one of the strong bands of tissue (ligaments) that connect and support the many bones in your feet. The ligament can be stretched too much or it can tear. A tear can be either partial or complete. The severity of the sprain depends on how much of the ligament was damaged or torn. °CAUSES °A foot sprain is usually caused by suddenly twisting or pivoting your foot. °RISK FACTORS °This injury is more likely to occur in people who: °· Play a sport, such as basketball or football. °· Exercise or play a sport without warming up. °· Start a new workout or sport. °· Suddenly increase how long or hard they exercise or play a sport. °SYMPTOMS °Symptoms of this condition start soon after an injury and include: °· Pain, especially in the arch of the foot. °· Bruising. °· Swelling. °· Inability to walk or use the foot to support body weight. °DIAGNOSIS °This condition is diagnosed with a medical history and physical exam. You may also have imaging tests, such as: °· X-rays to make sure there are no broken bones (fractures). °· MRI to see if the ligament has torn. °TREATMENT °Treatment varies depending on the severity of your sprain. Mild sprains can be treated with rest, ice, compression, and elevation (RICE). If your ligament is overstretched or partially torn, treatment usually involves keeping your foot in a fixed position (immobilization) for a period of time. To help you do this, your health care provider will apply a bandage, splint, or walking boot to keep your foot from moving until it heals. You may also be advised to use crutches or a scooter for a few weeks to avoid bearing weight on your foot while it is healing. °If your ligament is fully torn, you may need surgery to reconnect the ligament to the bone. After surgery, a cast or splint will be applied and will need to stay on your foot while it heals. °Your health care provider may also suggest exercises or physical therapy  to strengthen your foot. °HOME CARE INSTRUCTIONS °If You Have a Bandage, Splint, or Walking Boot: °· Wear it as directed by your health care provider. Remove it only as directed by your health care provider. °· Loosen the bandage, splint, or walking boot if your toes become numb and tingle, or if they turn cold and blue. °Bathing °· If your health care provider approves bathing and showering, cover the bandage or splint with a watertight plastic bag to protect it from water. Do not let the bandage or splint get wet. °Managing Pain, Stiffness, and Swelling  °· If directed, apply ice to the injured area: °¨ Put ice in a plastic bag. °¨ Place a towel between your skin and the bag. °¨ Leave the ice on for 20 minutes, 2-3 times per day. °· Move your toes often to avoid stiffness and to lessen swelling. °· Raise (elevate) the injured area above the level of your heart while you are sitting or lying down. °Driving °· Do not drive or operate heavy machinery while taking pain medicine. °· Do not drive while wearing a bandage, splint, or walking boot on a foot that you use for driving. °Activity °· Rest as directed by your health care provider. °· Do not use the injured foot to support your body weight until your health care provider says that you can. Use crutches or other supportive devices as directed by your health care provider. °· Ask your health care   provider what activities are safe for you. Gradually increase how much and how far you walk until your health care provider says it is safe to return to full activity.  Do any exercise or physical therapy as directed by your health care provider. General Instructions  If a splint was applied, do not put pressure on any part of it until it is fully hardened. This may take several hours.  Take medicines only as directed by your health care provider. These include over-the-counter medicines and prescription medicines.  Keep all follow-up visits as directed by your  health care provider. This is important.  When you can walk without pain, wear supportive shoes that have stiff soles. Do not wear flip-flops, and do not walk barefoot. SEEK MEDICAL CARE IF:  Your pain is not controlled with medicine.  Your bruising or swelling gets worse or does not get better with treatment.  Your splint or walking boot is damaged. SEEK IMMEDIATE MEDICAL CARE IF:  Your foot is numb or blue.  Your foot feels colder than normal.   This information is not intended to replace advice given to you by your health care provider. Make sure you discuss any questions you have with your health care provider.   Document Released: 08/03/2001 Document Revised: 06/28/2014 Document Reviewed: 12/15/2013 Elsevier Interactive Patient Education 2016 [employer].   Take the prescription pain medicine as needed along with daily Aleve for foot pain and swelling. Follow-up with Dr. Orland Jarred for ongoing symptoms. Wear a solid soled or platform-style shoe for comfort.

## 2015-03-28 NOTE — ED Notes (Signed)
Pt to ed with c/o right foot pain that started yesterday,  Swelling, denies injury/

## 2015-05-14 ENCOUNTER — Encounter: Payer: Self-pay | Admitting: Emergency Medicine

## 2015-05-14 ENCOUNTER — Emergency Department
Admission: EM | Admit: 2015-05-14 | Discharge: 2015-05-14 | Disposition: A | Discharge status: Home / Self Care | Payer: BLUE CROSS/BLUE SHIELD | Attending: Emergency Medicine | Admitting: Emergency Medicine

## 2015-05-14 DIAGNOSIS — R0981 Nasal congestion: Secondary | ICD-10-CM | POA: Diagnosis not present

## 2015-05-14 DIAGNOSIS — J111 Influenza due to unidentified influenza virus with other respiratory manifestations: Secondary | ICD-10-CM

## 2015-05-14 DIAGNOSIS — J029 Acute pharyngitis, unspecified: Secondary | ICD-10-CM | POA: Diagnosis present

## 2015-05-14 MED ORDER — PSEUDOEPH-BROMPHEN-DM 30-2-10 MG/5ML PO SYRP: 10.0000 mL | ORAL_SOLUTION | Freq: Four times a day (QID) | ORAL | Status: DC | PRN

## 2015-05-14 MED ORDER — PROMETHAZINE HCL 25 MG PO TABS: 25.0000 mg | ORAL_TABLET | Freq: Four times a day (QID) | ORAL | Status: DC | PRN

## 2015-05-14 MED ORDER — OSELTAMIVIR PHOSPHATE 75 MG PO CAPS: 75.0000 mg | ORAL_CAPSULE | Freq: Two times a day (BID) | ORAL | Status: DC

## 2015-05-14 NOTE — ED Provider Notes (Signed)
[facility] Emergency Department Provider Note  ____________________________________________  Time seen: Approximately 2:22 PM  I have reviewed the triage vital signs and the nursing notes.   HISTORY  Chief Complaint Nasal Congestion and Sore Throat    HPI Rose Ray is a 30 y.o. female , NAD, presents to the emergency department with sudden onset fever, body aches, sore throat, cough, chest congestion, nausea, vomiting as of this morning. States that her fianc was diagnosed with flu yesterday and is on Tamiflu. His symptoms were the same as her onset this morning. Has not taken anything over-the-counter for her symptoms at this time. Has had occasional abdominal discomfort with the nausea then emesis occurs. Denies any changes in urinary or bowel habits. No bloating or fullness. Has had no chest, neck, back pain.    History reviewed. No pertinent past medical history.  There are no active problems to display for this patient.   History reviewed. No pertinent past surgical history.  Current Outpatient Rx  Name  Route  Sig  Dispense  Refill  . azithromycin (ZITHROMAX Z-PAK) 250 MG tablet      Take 2 tablets (500 mg) daily x 3 days.   6 each   0   . brompheniramine-pseudoephedrine-DM 30-2-10 MG/5ML syrup   Oral   Take 10 mLs by mouth 4 (four) times daily as needed.   200 mL   0   . guaiFENesin (MUCINEX) 600 MG 12 hr tablet   Oral   Take 1 tablet (600 mg total) by mouth 2 (two) times daily.   30 tablet   0   . oseltamivir (TAMIFLU) 75 MG capsule   Oral   Take 1 capsule (75 mg total) by mouth 2 (two) times daily.   10 capsule   0   . promethazine (PHENERGAN) 25 MG tablet   Oral   Take 1 tablet (25 mg total) by mouth every 6 (six) hours as needed for nausea or vomiting.   10 tablet   0   . traMADol (ULTRAM) 50 MG tablet   Oral   Take 1 tablet (50 mg total) by mouth 2 (two) times daily.   10 tablet   0      Allergies Amoxicillin  History reviewed. No pertinent family history.  Social History Social History  Substance Use Topics  . Smoking status: Never Smoker   . Smokeless tobacco: None  . Alcohol Use: No     Review of Systems Constitutional: Positive fever/chills Eyes: No visual changes. No discharge or redness, swelling ENT: Positive nasal congestion, runny nose, sore throat. No ear pain, sinus pressure Cardiovascular: No chest pain. Respiratory: Positive cough. No shortness of breath. No wheezing.  Gastrointestinal: Positive abdominal pain, nausea, vomiting.  No diarrhea.  No constipation. Genitourinary: Negative for dysuria. No hematuria. No urinary hesitancy, urgency or increased frequency. Musculoskeletal: Negative for back, neck pain.  Skin: Negative for rash. Neurological: Negative for headaches, focal weakness or numbness. 10-point ROS otherwise negative.  ____________________________________________   PHYSICAL EXAM:  VITAL SIGNS: ED Triage Vitals  Enc Vitals Group     BP --      Pulse --      Resp --      Temp --      Temp src --      SpO2 --      Weight --      Height --      Head Cir --      Peak Flow --  Pain Score --      Pain Loc --      Pain Edu? --      Excl. in GC? --     Constitutional: Alert and oriented. Il appearing but in no acute distress. Eyes: Conjunctivae are normal. PERRL. EOMI without pain.  Head: Atraumatic. ENT:      Ears: TMs visualized bilaterally with mild serous effusion but no erythema, bulging, perforation.      Nose: Moderate congestion with trace clear rhinnorhea.      Mouth/Throat: Mucous membranes are moist. Pharynx with mild injection but without overt erythema, swelling, exudate. Neck: Supple with full range of motion. Hematological/Lymphatic/Immunilogical: No cervical lymphadenopathy. Cardiovascular: Normal rate, regular rhythm. Normal S1 and S2.  Good peripheral circulation. Respiratory: Normal  respiratory effort without tachypnea or retractions. Lungs CTAB. Gastrointestinal: Soft and nontender in all quadrants. No distention, guarding.  Neurologic:  Normal speech and language. No gross focal neurologic deficits are appreciated.  Skin:  Skin is warm, dry and intact. No rash noted. Psychiatric: Mood and affect are normal. Speech and behavior are normal. Patient exhibits appropriate insight and judgement.   ____________________________________________   LABS  None  ____________________________________________  EKG  None ____________________________________________  RADIOLOGY  None ____________________________________________    PROCEDURES  Procedure(s) performed: None   Medications - No data to display   ____________________________________________   INITIAL IMPRESSION / ASSESSMENT AND PLAN / ED COURSE  Patient's diagnosis is consistent with influenza. Patient will be discharged home with prescriptions for Tamiflu, Bromfed-DM, promethazine to take as directed. Patient given a work note for 2 days to allow rest and improvement. [facility] if symptoms persist past this treatment course. Patient is given ED precautions to return to the ED for any worsening or new symptoms.    ____________________________________________  FINAL CLINICAL IMPRESSION(S) / ED DIAGNOSES  Final diagnoses:  Influenza      NEW MEDICATIONS STARTED DURING THIS VISIT:  New Prescriptions   BROMPHENIRAMINE-PSEUDOEPHEDRINE-DM 30-2-10 MG/5ML SYRUP    Take 10 mLs by mouth 4 (four) times daily as needed.   OSELTAMIVIR (TAMIFLU) 75 MG CAPSULE    Take 1 capsule (75 mg total) by mouth 2 (two) times daily.   PROMETHAZINE (PHENERGAN) 25 MG TABLET    Take 1 tablet (25 mg total) by mouth every 6 (six) hours as needed for nausea or vomiting.         Hope PigeonJami L Loukisha Gunnerson, PA-C 05/14/15 1440  Jene Everyobert Kinner, MD 05/14/15 1447

## 2015-05-14 NOTE — ED Notes (Signed)
Pt presents to Ed with c/o of congestion, sore throat, abd discomfort that started this am. Fiance has the flu

## 2015-05-14 NOTE — Discharge Instructions (Signed)

## 2015-06-12 LAB — HM PAP SMEAR: HM PAP: NEGATIVE

## 2016-08-04 ENCOUNTER — Emergency Department
Admission: EM | Admit: 2016-08-04 | Discharge: 2016-08-04 | Disposition: A | Discharge status: Home / Self Care | Payer: BLUE CROSS/BLUE SHIELD | Attending: Emergency Medicine | Admitting: Emergency Medicine

## 2016-08-04 ENCOUNTER — Encounter: Payer: Self-pay | Admitting: Emergency Medicine

## 2016-08-04 DIAGNOSIS — Z79899 Other long term (current) drug therapy: Secondary | ICD-10-CM | POA: Insufficient documentation

## 2016-08-04 DIAGNOSIS — J0110 Acute frontal sinusitis, unspecified: Secondary | ICD-10-CM | POA: Insufficient documentation

## 2016-08-04 MED ORDER — AZITHROMYCIN 250 MG PO TABS: ORAL_TABLET | ORAL | 0 refills | Status: AC

## 2016-08-04 NOTE — ED Provider Notes (Signed)
[facility] Emergency Department Provider Note  ____________________________________________  Time seen: Approximately 5:23 PM  I have reviewed the triage vital signs and the nursing notes.   HISTORY  Chief Complaint Facial Pain    HPI Rose Ray is a 31 y.o. female presenting to the emergency department with maxillary and frontal sinus tenderness, congestion, purulent rhinorrhea and myalgias for approximately 3 days. Patient has a history of seasonal allergies for which she takes daily Claritin. Patient has a history of seasonal sinusitis treated with azithromycin given amoxicillin allergy. Patient denies associated chest pain, chest tightness, nausea, vomiting and abdominal pain. Patient has tried Sudafed but no other alleviating measures.   History reviewed. No pertinent past medical history.  There are no active problems to display for this patient.   History reviewed. No pertinent surgical history.  Prior to Admission medications   Medication Sig Start Date End Date Taking? Authorizing Provider  azithromycin (ZITHROMAX Z-PAK) 250 MG tablet Take 2 tablets (500 mg) on  Day 1,  followed by 1 tablet (250 mg) once daily on Days 2 through 5. 08/04/16 08/09/16  Orvil FeilWoods, Nazaria Riesen M, PA-C  brompheniramine-pseudoephedrine-DM 30-2-10 MG/5ML syrup Take 10 mLs by mouth 4 (four) times daily as needed. 05/14/15   Hagler, Jami L, PA-C  oseltamivir (TAMIFLU) 75 MG capsule Take 1 capsule (75 mg total) by mouth 2 (two) times daily. 05/14/15   Hagler, Jami L, PA-C  promethazine (PHENERGAN) 25 MG tablet Take 1 tablet (25 mg total) by mouth every 6 (six) hours as needed for nausea or vomiting. 05/14/15   Hagler, Jami L, PA-C  traMADol (ULTRAM) 50 MG tablet Take 1 tablet (50 mg total) by mouth 2 (two) times daily. 03/28/15   Menshew, Charlesetta IvoryJenise V Bacon, PA-C    Allergies Amoxicillin  History reviewed. No pertinent family history.  Social History Social History  Substance Use  Topics  . Smoking status: Never Smoker  . Smokeless tobacco: Never Used  . Alcohol use No     Review of Systems  Constitutional: No fever/chills Eyes: No visual changes. No discharge ENT: patient has had congestion and purulent rhinorrhea. Cardiovascular: no chest pain. Respiratory: no cough. No SOB. Gastrointestinal: No abdominal pain.  No nausea, no vomiting.  No diarrhea.  No constipation. Musculoskeletal: patient has had myalgias. Skin: Negative for rash, abrasions, lacerations, ecchymosis. Neurological: Negative for headaches, focal weakness or numbness.   ____________________________________________   PHYSICAL EXAM:  VITAL SIGNS: ED Triage Vitals  Enc Vitals Group     BP 08/04/16 1703 (!) 141/81     Pulse Rate 08/04/16 1703 100     Resp 08/04/16 1703 18     Temp 08/04/16 1703 98.4 F (36.9 C)     Temp Source 08/04/16 1703 Oral     SpO2 08/04/16 1703 97 %     Weight 08/04/16 1703 160 lb (72.6 kg)     Height 08/04/16 1703 5\' 5"  (1.651 m)     Head Circumference --      Peak Flow --      Pain Score 08/04/16 1702 10     Pain Loc --      Pain Edu? --      Excl. in GC? --      Constitutional: Alert and oriented. Well appearing and in no acute distress. Eyes: Conjunctivae are normal. PERRL. EOMI. Head: Patient has maxillary and frontal sinus tenderness. ENT:      Ears: tympanic membranes are pearly bilaterally.      Nose:  No congestion/rhinnorhea.      Mouth/Throat: Mucous membranes are moist. Uvula is midline. Neck:full range of motion. Cardiovascular: Normal rate, regular rhythm. Normal S1 and S2.  Good peripheral circulation. Respiratory: Normal respiratory effort without tachypnea or retractions. Lungs CTAB. Good air entry to the bases with no decreased or absent breath sounds. Skin:  Skin is warm, dry and intact. No rash noted. Psychiatric: Mood and affect are normal. Speech and behavior are normal. Patient exhibits appropriate insight and  judgement.   ____________________________________________   LABS (all labs ordered are listed, but only abnormal results are displayed)  Labs Reviewed - No data to display ____________________________________________  EKG   ____________________________________________  RADIOLOGY   No results found.  ____________________________________________    PROCEDURES  Procedure(s) performed:    Procedures    Medications - No data to display   ____________________________________________   INITIAL IMPRESSION / ASSESSMENT AND PLAN / ED COURSE  Pertinent labs & imaging results that were available during my care of the patient were reviewed by me and considered in my medical decision making (see chart for details).  Review of the [place] CSRS was performed in accordance of the [facility] prior to dispensing any controlled drugs.     Assessment and plan: Sinusitis: Patient presents to the emergency department with maxillary and frontal sinus tenderness along with purulent rhinorrhea and myalgias. Patient has a history of seasonal allergies and sinusitis. History and physical exam findings are consistent with sinusitis.Patient was discharged with azithromycin. Vital signs were reassuring prior to discharge. All patient questions were answered.     ____________________________________________  FINAL CLINICAL IMPRESSION(S) / ED DIAGNOSES  Final diagnoses:  Acute non-recurrent frontal sinusitis      NEW MEDICATIONS STARTED DURING THIS VISIT:  New Prescriptions   AZITHROMYCIN (ZITHROMAX Z-PAK) 250 MG TABLET    Take 2 tablets (500 mg) on  Day 1,  followed by 1 tablet (250 mg) once daily on Days 2 through 5.        This chart was dictated using voice recognition software/Dragon. Despite best efforts to proofread, errors can occur which can change the meaning. Any change was purely unintentional.    Orvil Feil, PA-C 08/04/16 1737    Phineas Semen, MD 08/04/16  [phone]

## 2016-08-04 NOTE — ED Triage Notes (Signed)
Pt c/o pain to frontal and maxillary sinuses.  Congestion as well.  Hx sinus infection and feels same. VSS. NAD. ambulatory to triage

## 2016-08-04 NOTE — ED Notes (Signed)

## 2016-09-20 ENCOUNTER — Encounter: Payer: Self-pay | Admitting: Emergency Medicine

## 2016-09-20 ENCOUNTER — Emergency Department
Admission: EM | Admit: 2016-09-20 | Discharge: 2016-09-20 | Disposition: A | Discharge status: Home / Self Care | Payer: BLUE CROSS/BLUE SHIELD | Attending: Student in an Organized Health Care Education/Training Program | Admitting: Student in an Organized Health Care Education/Training Program

## 2016-09-20 DIAGNOSIS — N12 Tubulo-interstitial nephritis, not specified as acute or chronic: Secondary | ICD-10-CM

## 2016-09-20 DIAGNOSIS — Z79899 Other long term (current) drug therapy: Secondary | ICD-10-CM | POA: Insufficient documentation

## 2016-09-20 LAB — URINALYSIS, COMPLETE (UACMP) WITH MICROSCOPIC
Bacteria, UA: NONE SEEN
Bilirubin Urine: NEGATIVE
GLUCOSE, UA: NEGATIVE mg/dL
HGB URINE DIPSTICK: NEGATIVE
Ketones, ur: NEGATIVE mg/dL
NITRITE: NEGATIVE
PH: 5 (ref 5.0–8.0)
PROTEIN: NEGATIVE mg/dL
Specific Gravity, Urine: 1.021 (ref 1.005–1.030)

## 2016-09-20 LAB — POCT PREGNANCY, URINE: Preg Test, Ur: NEGATIVE

## 2016-09-20 MED ORDER — LEVOFLOXACIN 750 MG PO TABS: 750.0000 mg | ORAL_TABLET | Freq: Every day | ORAL | 0 refills | Status: AC

## 2016-09-20 MED ORDER — LEVOFLOXACIN 750 MG PO TABS
750.0000 mg | ORAL_TABLET | Freq: Once | ORAL | Status: AC
  Administered 2016-09-20: 750 mg via ORAL
  Filled 2016-09-20: qty 1

## 2016-09-20 NOTE — ED Triage Notes (Signed)
C/O vaginal discharge and lower back pain x 3 days.  Discharge is yellow in color.

## 2016-09-20 NOTE — ED Notes (Signed)
See triage note  States she thinks she may have an urinary tract infection  States she is having some lower back pain and some vaginal discharge  Denies any dysuria

## 2016-09-20 NOTE — ED Provider Notes (Signed)
[facility] Emergency Department Provider Note  ____________________________________________  Time seen: Approximately 6:43 PM  I have reviewed the triage vital signs and the nursing notes.   HISTORY  Chief Complaint Vaginal Discharge    HPI Rose Ray is a 31 y.o. female presents to the emergency department with increased urinary frequency, nausea and bilateral flank pain for the past 3 days. She denies dysuria and hematuria. Patient states that her urine is very dark in color. She in a monogamous relationship with one sexual partner and has no concerns for sexually transmitted diseases. Patient denies vaginal pruritus and malodorous vaginal discharge. No prior diagnoses of STDs in the past. No dyspareunia. Patient has had one prior episode of nephrolithiasis and did not require emergent intervention. Patient denies a history of pyelonephritis.She has been afebrile.   History reviewed. No pertinent past medical history.  There are no active problems to display for this patient.   History reviewed. No pertinent surgical history.  Prior to Admission medications   Medication Sig Start Date End Date Taking? Authorizing Provider  brompheniramine-pseudoephedrine-DM 30-2-10 MG/5ML syrup Take 10 mLs by mouth 4 (four) times daily as needed. 05/14/15   Hagler, Jami L, PA-C  levofloxacin (LEVAQUIN) 750 MG tablet Take 1 tablet (750 mg total) by mouth daily. 09/20/16 09/24/16  Orvil FeilWoods, Philander Ake M, PA-C  oseltamivir (TAMIFLU) 75 MG capsule Take 1 capsule (75 mg total) by mouth 2 (two) times daily. 05/14/15   Hagler, Jami L, PA-C  promethazine (PHENERGAN) 25 MG tablet Take 1 tablet (25 mg total) by mouth every 6 (six) hours as needed for nausea or vomiting. 05/14/15   Hagler, Jami L, PA-C  traMADol (ULTRAM) 50 MG tablet Take 1 tablet (50 mg total) by mouth 2 (two) times daily. 03/28/15   Menshew, Charlesetta IvoryJenise V Bacon, PA-C    Allergies Amoxicillin  No family history on  file.  Social History Social History  Substance Use Topics  . Smoking status: Never Smoker  . Smokeless tobacco: Never Used  . Alcohol use No     Review of Systems  Constitutional: No fever/chills Eyes: No visual changes. No discharge ENT: No upper respiratory complaints. Cardiovascular: no chest pain. Respiratory: no cough. No SOB. Gastrointestinal: No abdominal pain.Patient has nausea  No diarrhea.  No constipation. Genitourinary: Patient has increased urinary frequency and bilateral flank pain. Musculoskeletal: Negative for musculoskeletal pain. Skin: Negative for rash, abrasions, lacerations, ecchymosis. Neurological: Negative for headaches, focal weakness or numbness.   ____________________________________________   PHYSICAL EXAM:  VITAL SIGNS: ED Triage Vitals  Enc Vitals Group     BP 09/20/16 1723 111/63     Pulse Rate 09/20/16 1723 60     Resp 09/20/16 1723 16     Temp 09/20/16 1723 98.5 F (36.9 C)     Temp Source 09/20/16 1723 Oral     SpO2 09/20/16 1723 97 %     Weight 09/20/16 1722 165 lb (74.8 kg)     Height 09/20/16 1722 5\' 5"  (1.651 m)     Head Circumference --      Peak Flow --      Pain Score 09/20/16 1721 10     Pain Loc --      Pain Edu? --      Excl. in GC? --      Constitutional: Alert and oriented. Well appearing and in no acute distress. Eyes: Conjunctivae are normal. PERRL. EOMI. Head: Atraumatic.  Cardiovascular: Normal rate, regular rhythm. Normal S1 and S2.  Good  peripheral circulation. Respiratory: Normal respiratory effort without tachypnea or retractions. Lungs CTAB. Good air entry to the bases with no decreased or absent breath sounds. Gastrointestinal: Bowel sounds 4 quadrants. Patient has suprapubic discomfort. No guarding or rigidity. No palpable masses. No distention. Patient has bilateral CVA tenderness. Musculoskeletal: Full range of motion to all extremities. No gross deformities appreciated. Neurologic:  Normal speech  and language. No gross focal neurologic deficits are appreciated.  Skin:  Skin is warm, dry and intact. No rash noted. Psychiatric: Mood and affect are normal. Speech and behavior are normal. Patient exhibits appropriate insight and judgement.   ____________________________________________   LABS (all labs ordered are listed, but only abnormal results are displayed)  Labs Reviewed  URINALYSIS, COMPLETE (UACMP) WITH MICROSCOPIC - Abnormal; Notable for the following:       Result Value   Color, Urine YELLOW (*)    APPearance HAZY (*)    Leukocytes, UA LARGE (*)    Squamous Epithelial / LPF 0-5 (*)    All other components within normal limits  POC URINE PREG, ED  POCT PREGNANCY, URINE   ____________________________________________  EKG   ____________________________________________  RADIOLOGY   No results found.  ____________________________________________    PROCEDURES  Procedure(s) performed:    Procedures    Medications  levofloxacin (LEVAQUIN) tablet 750 mg (750 mg Oral Given 09/20/16 1815)     ____________________________________________   INITIAL IMPRESSION / ASSESSMENT AND PLAN / ED COURSE  Pertinent labs & imaging results that were available during my care of the patient were reviewed by me and considered in my medical decision making (see chart for details).  Review of the [place] CSRS was performed in accordance of the [facility] prior to dispensing any controlled drugs.  Assessment and plan Pyelonephritis Patient presents to the emergency department with increased urinary frequency, bilateral flank pain and nausea. Urinalysis conducted in the emergency department is contributory for acute cystitis. I suspect early pyelonephritis given aforementioned symptoms and CVA tenderness on physical exam.. Patient was given Levaquin in the emergency department. She was discharged with Levaquin. Patient was advised to follow up with primary care as needed. Vital signs  are reassuring prior to discharge. All patient questions were answered.     ____________________________________________  FINAL CLINICAL IMPRESSION(S) / ED DIAGNOSES  Final diagnoses:  Pyelonephritis      NEW MEDICATIONS STARTED DURING THIS VISIT:  Discharge Medication List as of 09/20/2016  6:08 PM    START taking these medications   Details  levofloxacin (LEVAQUIN) 750 MG tablet Take 1 tablet (750 mg total) by mouth daily., Starting Fri 09/20/2016, Until Tue 09/24/2016, Print            This chart was dictated using voice recognition software/Dragon. Despite best efforts to proofread, errors can occur which can change the meaning. Any change was purely unintentional.    Orvil FeilWoods, Lafe Clerk M, PA-C 09/20/16 [id number]    Robinson, Patrick, MD 09/21/16 [phone]

## 2016-11-05 ENCOUNTER — Encounter: Payer: Self-pay | Admitting: Emergency Medicine

## 2016-11-05 ENCOUNTER — Emergency Department
Admission: EM | Admit: 2016-11-05 | Discharge: 2016-11-05 | Disposition: A | Discharge status: Home / Self Care | Payer: Self-pay | Attending: Emergency Medicine | Admitting: Emergency Medicine

## 2016-11-05 DIAGNOSIS — Z79899 Other long term (current) drug therapy: Secondary | ICD-10-CM | POA: Insufficient documentation

## 2016-11-05 DIAGNOSIS — K0889 Other specified disorders of teeth and supporting structures: Secondary | ICD-10-CM

## 2016-11-05 DIAGNOSIS — K047 Periapical abscess without sinus: Secondary | ICD-10-CM | POA: Insufficient documentation

## 2016-11-05 MED ORDER — SULFAMETHOXAZOLE-TRIMETHOPRIM 800-160 MG PO TABS: 1.0000 | ORAL_TABLET | Freq: Two times a day (BID) | ORAL | 0 refills | Status: DC

## 2016-11-05 MED ORDER — IBUPROFEN 800 MG PO TABS: 800.0000 mg | ORAL_TABLET | Freq: Three times a day (TID) | ORAL | 0 refills | Status: DC | PRN

## 2016-11-05 MED ORDER — LIDOCAINE VISCOUS 2 % MT SOLN
15.0000 mL | Freq: Once | OROMUCOSAL | Status: AC
  Administered 2016-11-05: 15 mL via OROMUCOSAL
  Filled 2016-11-05: qty 15

## 2016-11-05 NOTE — ED Provider Notes (Signed)
[facility] Emergency Department Provider Note   ____________________________________________   I have reviewed the triage vital signs and the nursing notes.   HISTORY  Chief Complaint Dental Pain    HPI Rose Ray is a 31 y.o. female presents to the emergency department with right upper jaw pain secondary to a toothache that developed approximately one week ago. Patient denies any injury to the tooth. She feels this tooth may be developing a cavity or infection. Patient reports pain, redness along the gumline and swelling. She is unable to bite or eat anything on the right side of her mouth. Patient reports attempting to make an appointment at [facility] however they had no availability therefore she came to the emergency department seeking care. Patient denies fever, chills, headache, vision changes, chest pain, chest tightness, shortness of breath, abdominal pain, nausea and vomiting.  History reviewed. No pertinent past medical history.  There are no active problems to display for this patient.   History reviewed. No pertinent surgical history.  Prior to Admission medications   Medication Sig Start Date End Date Taking? Authorizing Provider  brompheniramine-pseudoephedrine-DM 30-2-10 MG/5ML syrup Take 10 mLs by mouth 4 (four) times daily as needed. 05/14/15   Hagler, Jami L, PA-C  ibuprofen (ADVIL,MOTRIN) 800 MG tablet Take 1 tablet (800 mg total) by mouth every 8 (eight) hours as needed. 11/05/16   Khayman Kirsch M, PA-C  oseltamivir (TAMIFLU) 75 MG capsule Take 1 capsule (75 mg total) by mouth 2 (two) times daily. 05/14/15   Hagler, Jami L, PA-C  promethazine (PHENERGAN) 25 MG tablet Take 1 tablet (25 mg total) by mouth every 6 (six) hours as needed for nausea or vomiting. 05/14/15   Hagler, Jami L, PA-C  sulfamethoxazole-trimethoprim (BACTRIM DS,SEPTRA DS) 800-160 MG tablet Take 1 tablet by mouth 2 (two) times daily. 11/05/16   Dennisha Mouser M, PA-C   traMADol (ULTRAM) 50 MG tablet Take 1 tablet (50 mg total) by mouth 2 (two) times daily. 03/28/15   Menshew, Charlesetta Ivory, PA-C    Allergies Amoxicillin  No family history on file.  Social History Social History  Substance Use Topics  . Smoking status: Never Smoker  . Smokeless tobacco: Never Used  . Alcohol use No    Review of Systems Constitutional: Negative for fever/chills Eyes: No visual changes. ENT:  Negative for sore throat and for difficulty swallowing. Positive for dental pain along right upper jaw.  Cardiovascular: Denies chest pain. Respiratory: Denies cough. Denies shortness of breath. Gastrointestinal: No abdominal pain.  No nausea, vomiting, diarrhea. Musculoskeletal: Negative for back pain. Skin: Negative for rash. Neurological: Negative for headaches.  ____________________________________________   PHYSICAL EXAM:  VITAL SIGNS: ED Triage Vitals  Enc Vitals Group     BP 11/05/16 1536 136/75     Pulse Rate 11/05/16 1536 70     Resp 11/05/16 1536 16     Temp 11/05/16 1536 98.4 F (36.9 C)     Temp Source 11/05/16 1536 Oral     SpO2 11/05/16 1536 100 %     Weight 11/05/16 1531 160 lb (72.6 kg)     Height 11/05/16 1531  (1.651 m)     Head Circumference --      Peak Flow --      Pain Score 11/05/16 1531 10     Pain Loc --      Pain Edu? --      Excl. in GC? --     Constitutional: Alert  and oriented. Well appearing and in no acute distress.  Eyes: Conjunctivae are normal. PERRL. EOMI  Head: Normocephalic and atraumatic. ENT:      Ears: Canals clear. TMs intact bilaterally.      Nose: No congestion/rhinnorhea.      Mouth/Throat: Mucous membranes are moist. Oropharynx nonerythematous. Right upper jaw, 1st molar dental pain with dental caries. Right upper jaw line erythema with mild edema. Oral muscosa normal.  Neck:Supple. No thyromegaly. No stridor.  Cardiovascular: Normal rate, regular rhythm. Good peripheral circulation. Respiratory: Normal  respiratory effort without tachypnea or retractions. Lungs CTAB. No wheezes/rales/rhonchi. Good air entry to the bases with no decreased or absent breath sounds. Hematological/Lymphatic/Immunological: No cervical lymphadenopathy. Cardiovascular: Normal rate, regular rhythm. Normal distal pulses. Gastrointestinal: Bowel sounds 4 quadrants. Soft and nontender to palpation.  Musculoskeletal: Nontender with normal range of motion in all extremities. Neurologic: Normal speech and language.  Skin:  Skin is warm, dry and intact. No rash noted. Psychiatric: Mood and affect are normal. Speech and behavior are normal. Patient exhibits appropriate insight and judgement.  ____________________________________________   LABS (all labs ordered are listed, but only abnormal results are displayed)  Labs Reviewed - No data to display ____________________________________________  EKG none ____________________________________________  RADIOLOGY none ____________________________________________   PROCEDURES  Procedure(s) performed: no    Critical Care performed: no ____________________________________________   INITIAL IMPRESSION / ASSESSMENT AND PLAN / ED COURSE  Pertinent labs & imaging results that were available during my care of the patient were reviewed by me and considered in my medical decision making (see chart for details).   Patient presents to emergency department with right upper jaw dental pain for 1 week.Marland Kitchen. History and physical exam findings are reassuring symptoms are consistent with right upper first molar dental infection. Patient will be prescribed Bactrim for interbody coverage and ibuprofen for inflammation and pain. Patient advised to follow up with a dental clinic as soon as she can schedule an appointment or return to the emergency department if symptoms return or worsen. Patient informed of clinical course, understand medical decision-making process, and agree with  plan.     ____________________________________________   FINAL CLINICAL IMPRESSION(S) / ED DIAGNOSES  Final diagnoses:  Pain, dental  Dental infection       NEW MEDICATIONS STARTED DURING THIS VISIT:  Discharge Medication List as of 11/05/2016  4:25 PM    START taking these medications   Details  ibuprofen (ADVIL,MOTRIN) 800 MG tablet Take 1 tablet (800 mg total) by mouth every 8 (eight) hours as needed., Starting Tue 11/05/2016, Print         Note:  This document was prepared using Dragon voice recognition software and may include unintentional dictation errors.    Clois ComberLittle, Lorma Heater M, PA-C 11/05/16 1745    Jeanmarie PlantMcShane, James A, MD 11/05/16 2127

## 2016-11-05 NOTE — ED Triage Notes (Signed)
Right upper jaw pain x1 week, attempting to see dentist (unsuccessful),

## 2016-11-05 NOTE — Discharge Instructions (Signed)
Take medication as prescribed. Return to emergency department if symptoms worsen and follow-up with PCP as needed.    Swish with viscous lidocaine as needed.

## 2017-04-27 ENCOUNTER — Encounter: Payer: Self-pay | Admitting: Emergency Medicine

## 2017-04-27 ENCOUNTER — Emergency Department
Admission: EM | Admit: 2017-04-27 | Discharge: 2017-04-27 | Disposition: A | Discharge status: Home / Self Care | Payer: Self-pay | Attending: Student in an Organized Health Care Education/Training Program | Admitting: Student in an Organized Health Care Education/Training Program

## 2017-04-27 DIAGNOSIS — R51 Headache: Secondary | ICD-10-CM | POA: Insufficient documentation

## 2017-04-27 DIAGNOSIS — R519 Headache, unspecified: Secondary | ICD-10-CM

## 2017-04-27 LAB — URINALYSIS, COMPLETE (UACMP) WITH MICROSCOPIC
Bacteria, UA: NONE SEEN
Bilirubin Urine: NEGATIVE
Glucose, UA: NEGATIVE mg/dL
Ketones, ur: NEGATIVE mg/dL
Nitrite: NEGATIVE
Protein, ur: NEGATIVE mg/dL
Specific Gravity, Urine: 1.011 (ref 1.005–1.030)
pH: 6 (ref 5.0–8.0)

## 2017-04-27 LAB — POCT PREGNANCY, URINE: Preg Test, Ur: NEGATIVE

## 2017-04-27 MED ORDER — DIPHENHYDRAMINE HCL 50 MG/ML IJ SOLN
12.5000 mg | Freq: Once | INTRAMUSCULAR | Status: DC
  Filled 2017-04-27: qty 1

## 2017-04-27 MED ORDER — SODIUM CHLORIDE 0.9 % IV BOLUS (SEPSIS)
1000.0000 mL | Freq: Once | INTRAVENOUS | Status: AC
  Administered 2017-04-27: 1000 mL via INTRAVENOUS

## 2017-04-27 MED ORDER — ACETAMINOPHEN 500 MG PO TABS
1000.0000 mg | ORAL_TABLET | Freq: Once | ORAL | Status: AC
  Administered 2017-04-27: 1000 mg via ORAL
  Filled 2017-04-27: qty 2

## 2017-04-27 MED ORDER — PROCHLORPERAZINE EDISYLATE 5 MG/ML IJ SOLN
10.0000 mg | Freq: Once | INTRAMUSCULAR | Status: AC
  Administered 2017-04-27: 10 mg via INTRAVENOUS
  Filled 2017-04-27: qty 2

## 2017-04-27 MED ORDER — DEXAMETHASONE SODIUM PHOSPHATE 10 MG/ML IJ SOLN
10.0000 mg | Freq: Once | INTRAMUSCULAR | Status: AC
  Administered 2017-04-27: 10 mg via INTRAVENOUS
  Filled 2017-04-27: qty 1

## 2017-04-27 NOTE — ED Provider Notes (Signed)
[facility] Emergency Department Provider Note    None    (approximate)  I have reviewed the triage vital signs and the nursing notes.   HISTORY  Chief Complaint Migraine    HPI Rose Ray is a 32 y.o. female with a history of migraine headaches last one being a few weeks ago presents with headache that started yesterday not improving with Advil migraine.  Denies any fevers.  Does endorse photophobia and phonophobia.  No numbness or tingling.  No blurry vision.  This is not the worst headache of her life.  It is gradual in onset.  Does have some nausea associated with it.  Has had to present to the ER before for headaches.  History reviewed. No pertinent past medical history. No family history on file. History reviewed. No pertinent surgical history. There are no active problems to display for this patient.     Prior to Admission medications   Medication Sig Start Date End Date Taking? Authorizing Provider  medroxyPROGESTERone (DEPO-PROVERA) 150 MG/ML injection Inject 150 mg into the muscle every 3 (three) months.   Yes [provider]    Allergies Amoxicillin    Social History Social History   Tobacco Use  . Smoking status: Never Smoker  . Smokeless tobacco: Never Used  Substance Use Topics  . Alcohol use: No  . Drug use: No    Review of Systems Patient denies headaches, rhinorrhea, blurry vision, numbness, shortness of breath, chest pain, edema, cough, abdominal pain, nausea, vomiting, diarrhea, dysuria, fevers, rashes or hallucinations unless otherwise stated above in HPI. ____________________________________________   PHYSICAL EXAM:  VITAL SIGNS: Vitals:   04/27/17 1132  BP: 137/84  Pulse: 82  Resp: 16  Temp: 98.5 F (36.9 C)  SpO2: 97%    Constitutional: Alert and oriented.  in no acute distress. Eyes: Conjunctivae are normal.  Head: Atraumatic. Nose: No congestion/rhinnorhea. Mouth/Throat: Mucous  membranes are moist.   Neck: No stridor. Painless ROM.  Cardiovascular: Normal rate, regular rhythm. Grossly normal heart sounds.  Good peripheral circulation. Respiratory: Normal respiratory effort.  No retractions. Lungs CTAB. Gastrointestinal: Soft and nontender. No distention. No abdominal bruits. No CVA tenderness. Genitourinary:  Musculoskeletal: No lower extremity tenderness nor edema.  No joint effusions. Neurologic:  Normal speech and language. No gross focal neurologic deficits are appreciated. No facial droop Skin:  Skin is warm, dry and intact. No rash noted. Psychiatric: Mood and affect are normal. Speech and behavior are normal.  ____________________________________________   LABS (all labs ordered are listed, but only abnormal results are displayed)  Results for orders placed or performed during the hospital encounter of 04/27/17 (from the past 24 hour(s))  Urinalysis, Complete w Microscopic     Status: Abnormal   Collection Time: 04/27/17 11:37 AM  Result Value Ref Range   Color, Urine YELLOW (A) YELLOW   APPearance CLOUDY (A) CLEAR   Specific Gravity, Urine 1.011 1.005 - 1.030   pH 6.0 5.0 - 8.0   Glucose, UA NEGATIVE NEGATIVE mg/dL   Hgb urine dipstick SMALL (A) NEGATIVE   Bilirubin Urine NEGATIVE NEGATIVE   Ketones, ur NEGATIVE NEGATIVE mg/dL   Protein, ur NEGATIVE NEGATIVE mg/dL   Nitrite NEGATIVE NEGATIVE   Leukocytes, UA LARGE (A) NEGATIVE   RBC / HPF 6-30 0 - 5 RBC/hpf   WBC, UA TOO NUMEROUS TO COUNT 0 - 5 WBC/hpf   Bacteria, UA NONE SEEN NONE SEEN   Squamous Epithelial / LPF 0-5 (A) NONE SEEN  WBC Clumps PRESENT    Mucus PRESENT   Pregnancy, urine POC     Status: None   Collection Time: 04/27/17 11:42 AM  Result Value Ref Range   Preg Test, Ur NEGATIVE NEGATIVE   ____________________________________________ ____________________________________________   PROCEDURES  Procedure(s) performed:  Procedures    Critical Care performed:  no ____________________________________________   INITIAL IMPRESSION / ASSESSMENT AND PLAN / ED COURSE  Pertinent labs & imaging results that were available during my care of the patient were reviewed by me and considered in my medical decision making (see chart for details).  DDX: migraine, tension, cluster, sah, meningitis, glaucoma  Rose Ray is a 32 y.o. who presents to the ED with with Hx of migraines p/w HA for last 2 days. Not worst HA ever. Gradual onset. HA similar to previous episodes. Denies focal neurologic symptoms. Denies trauma. No fevers or neck pain. No vision loss. Afebrile in ED. VSS. Exam as above. No meningeal signs. No CN, motor, sensory or cerebellar deficits. Temporal arteries palpable and non-tender. Appears well and non-toxic.  Will provide IV fluids for hydration and IV medications for symptom control.  Likely tension, non-specific or possible migraine HA. Clinical picture is not consistent with ICH, SAH, SDH, EDH, TIA, or CVA. No concern for meningitis or encephalitis. No concern for GCA/Temporal arteritis.  ----------------------------------------- 2:24 PM on 04/27/2017 -----------------------------------------   Pain improved. Repeat neuro exam is again without focal deficit, nuchal rigidity or evidence of meningeal irritation.  Stable to D/C home, follow up with PCP or Neurology if persistent recurrent Has.  Have discussed with the patient and available family all diagnostics and treatments performed thus far and all questions were answered to the best of my ability. The patient demonstrates understanding and agreement with plan.        ____________________________________________   FINAL CLINICAL IMPRESSION(S) / ED DIAGNOSES  Final diagnoses:  Bad headache      NEW MEDICATIONS STARTED DURING THIS VISIT:  New Prescriptions   No medications on file     Note:  This document was prepared using Dragon voice recognition software and may  include unintentional dictation errors.    Willy Eddy, MD 04/27/17 [phone]

## 2017-04-27 NOTE — Discharge Instructions (Signed)

## 2017-04-27 NOTE — ED Triage Notes (Signed)
Pt comes into the ED via POV c/o migraine.  Patient states she has a h/o migraines in the past and this feels the same.  Patient having photosensitivity and the pain is worse with loud noises.  Patient states the pain started yesterday and has progressed even after taking OTC medications.  Patient otherwise neurologically intact at this time and in NAD.

## 2017-09-12 DIAGNOSIS — E663 Overweight: Secondary | ICD-10-CM | POA: Insufficient documentation

## 2018-04-29 DIAGNOSIS — E663 Overweight: Secondary | ICD-10-CM

## 2018-10-06 ENCOUNTER — Other Ambulatory Visit: Payer: Self-pay | Admitting: Family Medicine

## 2018-10-06 DIAGNOSIS — M549 Dorsalgia, unspecified: Secondary | ICD-10-CM

## 2018-10-07 ENCOUNTER — Ambulatory Visit
Admission: RE | Admit: 2018-10-07 | Discharge: 2018-10-07 | Disposition: A | Discharge status: Home / Self Care | Payer: Self-pay | Source: Ambulatory Visit | Attending: Family Medicine | Admitting: Family Medicine

## 2018-10-07 ENCOUNTER — Other Ambulatory Visit: Payer: Self-pay | Admitting: Family Medicine

## 2018-10-07 DIAGNOSIS — M549 Dorsalgia, unspecified: Secondary | ICD-10-CM

## 2018-10-23 ENCOUNTER — Other Ambulatory Visit: Payer: Self-pay

## 2018-10-23 ENCOUNTER — Encounter: Payer: Self-pay | Admitting: Nurse Practitioner

## 2018-10-23 ENCOUNTER — Ambulatory Visit (LOCAL_COMMUNITY_HEALTH_CENTER): Payer: Self-pay | Admitting: Nurse Practitioner

## 2018-10-23 DIAGNOSIS — Z3042 Encounter for surveillance of injectable contraceptive: Secondary | ICD-10-CM

## 2018-10-23 DIAGNOSIS — Z30013 Encounter for initial prescription of injectable contraceptive: Secondary | ICD-10-CM

## 2018-10-23 DIAGNOSIS — Z3009 Encounter for other general counseling and advice on contraception: Secondary | ICD-10-CM

## 2018-10-23 MED ORDER — MEDROXYPROGESTERONE ACETATE 150 MG/ML IM SUSP
150.0000 mg | Freq: Once | INTRAMUSCULAR | Status: AC
  Administered 2018-10-23: 17:00:00 150 mg via INTRAMUSCULAR

## 2018-10-23 NOTE — Progress Notes (Addendum)
[place] problem visit  [facility]  Subjective:  Rose Ray is a 33 y.o. being seen today for DMPA injection - desires PE as well - advised to schedule in 11-13 wks during next DMPA injection appoitment  Chief Complaint  Patient presents with  . Contraception    DMPA injection    Here for DMPA injection  Last injection 12 wks ago Desires PE at next DMPA visit    Does the patient have a current or past history of drug use? No   No components found for: HCV]   Health Maintenance Due  Topic Date Due  . HIV Screening  01/25/1999  . TETANUS/TDAP  01/25/2003  . PAP SMEAR-Modifier  06/12/2018  . INFLUENZA VACCINE  09/26/2018    Review of Systems  All other systems reviewed and are negative.   The following portions of the patient's history were reviewed and updated as appropriate: allergies, current medications, past family history, past medical history, past social history, past surgical history and problem list. Problem list updated.   See flowsheet for other program required questions.  Objective:  There were no vitals filed for this visit.  Physical Exam Vitals signs reviewed.  Constitutional:      Appearance: Normal appearance. She is well-developed and normal weight.  Pulmonary:     Effort: Pulmonary effort is normal.  Skin:    General: Skin is warm and dry.  Neurological:     Mental Status: She is alert.  Psychiatric:        Behavior: Behavior is cooperative.       Assessment and Plan:  Rose Ray is a 33 y.o. female presenting to the [facility] Department for a Women's Health problem visit  There are no diagnoses linked to this encounter.  1. Encounter for Depo-Provera contraception Given in arm per client request - medroxyPROGESTERone (DEPO-PROVERA) injection 150 mg  2. Family planning Continue to monitor  Return for 11-13 weeks for DMPA, Yearly physical exam.  No future  appointments.  Berniece Andreas, NP

## 2019-01-15 ENCOUNTER — Ambulatory Visit (LOCAL_COMMUNITY_HEALTH_CENTER): Payer: Self-pay

## 2019-01-15 ENCOUNTER — Other Ambulatory Visit: Payer: Self-pay

## 2019-01-15 VITALS — BP 132/84 | Ht 62.5 in | Wt 163.0 lb

## 2019-01-15 DIAGNOSIS — Z3009 Encounter for other general counseling and advice on contraception: Secondary | ICD-10-CM

## 2019-01-15 DIAGNOSIS — Z3042 Encounter for surveillance of injectable contraceptive: Secondary | ICD-10-CM

## 2019-01-15 MED ORDER — MEDROXYPROGESTERONE ACETATE 150 MG/ML IM SUSP
150.0000 mg | Freq: Once | INTRAMUSCULAR | Status: AC
  Administered 2019-01-15: 150 mg via INTRAMUSCULAR

## 2019-01-15 NOTE — Progress Notes (Signed)
Last depo at [facility] 10/23/2018; 12.0 weeks post depo today. Last physical at [facility] was 08/2017. Consulted with provider regarding pt request to continue with depo today. Pt states she is in pain (in upper back that extends up neck and causing migraines) today and seeing PCP for it. Per verbal order by Donnal Moat, CNM administered DMPA 150 mg IM today and counseled pt to address BP with the PCP that is also seeing her for pain. Pt to schedule physical/problem visit to see provider when next depo is due.

## 2019-04-06 ENCOUNTER — Other Ambulatory Visit: Payer: Self-pay | Admitting: Physician Assistant

## 2019-04-06 DIAGNOSIS — Z3042 Encounter for surveillance of injectable contraceptive: Secondary | ICD-10-CM

## 2019-04-06 MED ORDER — MEDROXYPROGESTERONE ACETATE 150 MG/ML IM SUSP
150.0000 mg | Freq: Once | INTRAMUSCULAR | Status: AC
  Administered 2019-04-07: 15:00:00 150 mg via INTRAMUSCULAR

## 2019-04-06 NOTE — Progress Notes (Signed)
Per chart, last RP 08/2017.  CBE and pap due 2020.  Provided that patient desires to continue with Depo and BP is normal, ok to continue with Depo for now.  Patient should schedule for provider visit with next Depo to have pap and depo done.

## 2019-04-07 ENCOUNTER — Ambulatory Visit (LOCAL_COMMUNITY_HEALTH_CENTER): Payer: Self-pay

## 2019-04-07 ENCOUNTER — Other Ambulatory Visit: Payer: Self-pay

## 2019-04-07 VITALS — BP 120/77 | Ht 62.5 in | Wt 167.5 lb

## 2019-04-07 DIAGNOSIS — Z30013 Encounter for initial prescription of injectable contraceptive: Secondary | ICD-10-CM

## 2019-04-07 DIAGNOSIS — Z3042 Encounter for surveillance of injectable contraceptive: Secondary | ICD-10-CM

## 2019-04-07 DIAGNOSIS — Z3009 Encounter for other general counseling and advice on contraception: Secondary | ICD-10-CM

## 2019-04-07 NOTE — Progress Notes (Signed)
11.5 weeks post depo today. DMPA 150 mg IM administered today per Sadie Haber, PA order dated 04/06/2019; pt to schedule provider visit for PAP when next depo is due.

## 2019-04-08 ENCOUNTER — Ambulatory Visit: Payer: Self-pay

## 2019-07-06 ENCOUNTER — Ambulatory Visit (LOCAL_COMMUNITY_HEALTH_CENTER): Payer: BLUE CROSS/BLUE SHIELD | Admitting: Advanced Practice Midwife

## 2019-07-06 ENCOUNTER — Other Ambulatory Visit: Payer: Self-pay

## 2019-07-06 ENCOUNTER — Encounter: Payer: Self-pay | Admitting: Advanced Practice Midwife

## 2019-07-06 ENCOUNTER — Ambulatory Visit: Payer: Self-pay

## 2019-07-06 VITALS — BP 119/78 | Ht 65.0 in | Wt 165.8 lb

## 2019-07-06 DIAGNOSIS — Z3009 Encounter for other general counseling and advice on contraception: Secondary | ICD-10-CM

## 2019-07-06 DIAGNOSIS — Z30013 Encounter for initial prescription of injectable contraceptive: Secondary | ICD-10-CM

## 2019-07-06 DIAGNOSIS — Z3042 Encounter for surveillance of injectable contraceptive: Secondary | ICD-10-CM

## 2019-07-06 NOTE — Progress Notes (Signed)
Patient here for PE and Depo. 13 weeks since last Depo. Consents signed. Last PE 08/2017, CBE 2020. Per [facility], needs cotest Pap 2022.Marland KitchenBurt Knack, RN

## 2019-07-06 NOTE — Progress Notes (Signed)
Family Planning Visit- Initial Visit  Subjective:  Rose Ray is a 34 y.o. engaged WF G2P1 nonsmoker.  No obstetric history on file.   being seen today for an initial well woman visit and to discuss family planning options.  She is currently using Depo-Provera injections for pregnancy prevention. Patient reports she does not want a pregnancy in the next year.  Patient has the following medical conditions has Overweight with body mass index BMI=27.5 on their problem list.  Chief Complaint  Patient presents with  . Annual Exam  . Contraception    Depo    Patient reports no menses on DMPA.  Last sex 06/26/19 without condom. 1 sex partner x 10 years.  Happy with DMPA and wants more.  Last PE 08/2017.  CBE due 2020.  Pap due 2022.  Patient denies any problems  Body mass index is 27.59 kg/m. - Patient is eligible for diabetes screening based on BMI and age >70?  not applicable YF7C ordered? no  Patient reports 1 of partners in last year. Desires STI screening?  No - declines  Has patient been screened once for HCV in the past?  No  No results found for: HCVAB  Does the patient have current of drug use, have a partner with drug use, and/or has been incarcerated since last result? No  If yes-- Screen for HCV through [facility]   Does the patient meet criteria for HBV testing? No  Criteria:  -Household, sexual or needle sharing contact with HBV -History of drug use -HIV positive -Those with known Hep C   Health Maintenance Due  Topic Date Due  . HIV Screening  Never done  . TETANUS/TDAP  Never done  . PAP SMEAR-Modifier  06/12/2018    Review of Systems  Neurological: Positive for headaches (migraines qomonth relieved with Advil migraine).  All other systems reviewed and are negative.   The following portions of the patient's history were reviewed and updated as appropriate: allergies, current medications, past family history, past medical history, past social history,  past surgical history and problem list. Problem list updated.   See flowsheet for other program required questions.  Objective:   Vitals:   07/06/19 1611  BP: 119/78  Weight: 165 lb 12.8 oz (75.2 kg)  Height: 5\' 5"  (1.651 m)    Physical Exam Constitutional:      Appearance: Normal appearance.  HENT:     Head: Normocephalic and atraumatic.     Mouth/Throat:     Mouth: Mucous membranes are moist.  Eyes:     Conjunctiva/sclera: Conjunctivae normal.  Cardiovascular:     Rate and Rhythm: Normal rate and regular rhythm.  Pulmonary:     Effort: Pulmonary effort is normal.     Breath sounds: Normal breath sounds.  Chest:     Breasts:        Right: Normal.        Left: Normal.  Abdominal:     Palpations: Abdomen is soft.     Comments: Poor tone, soft without tenderness  Genitourinary:    Comments: Not examined; pap not due, >35, denies sxs Musculoskeletal:        General: Normal range of motion.     Cervical back: Normal range of motion and neck supple.  Skin:    General: Skin is warm and dry.  Neurological:     Mental Status: She is alert.  Psychiatric:        Mood and Affect: Mood normal.  Assessment and Plan:  Rose Ray is a 34 y.o. female presenting to the [facility] Department for an initial well woman exam/family planning visit  Contraception counseling: Reviewed all forms of birth control options in the tiered based approach. available including abstinence; over the counter/barrier methods; hormonal contraceptive medication including pill, patch, ring, injection,contraceptive implant, ECP; hormonal and nonhormonal IUDs; permanent sterilization options including vasectomy and the various tubal sterilization modalities. Risks, benefits, and typical effectiveness rates were reviewed.  Questions were answered.  Written information was also given to the patient to review.  Patient desires DMPA, this was prescribed for patient. She will follow up  in  11-13 wks for surveillance.  She was told to call with any further questions, or with any concerns about this method of contraception.  Emphasized use of condoms 100% of the time for STI prevention.  Patient was offered ECP. ECP was not accepted by the patient. ECP counseling was not given - see RN documentation  1. Family planning   2. Encounter for surveillance of injectable contraceptive DMPA 150 mg today and q 11-13 wks x 1 year     Return for 11-13 wk DMPA.  No future appointments.  Alberteen Spindle, CNM

## 2019-07-07 ENCOUNTER — Encounter: Payer: Self-pay | Admitting: Advanced Practice Midwife

## 2019-07-07 MED ORDER — MEDROXYPROGESTERONE ACETATE 150 MG/ML IM SUSP
150.0000 mg | Freq: Once | INTRAMUSCULAR | Status: AC
  Administered 2019-07-06: 150 mg via INTRAMUSCULAR

## 2019-07-07 NOTE — Addendum Note (Signed)
Addended by: Burt Knack on: 07/07/2019 08:30 AM   Modules accepted: Orders

## 2019-07-07 NOTE — Progress Notes (Signed)
Depo given, right glute, tolerated well, next Depo card given.Burt Knack, RN

## 2019-09-21 ENCOUNTER — Ambulatory Visit (LOCAL_COMMUNITY_HEALTH_CENTER): Payer: BLUE CROSS/BLUE SHIELD

## 2019-09-21 ENCOUNTER — Other Ambulatory Visit: Payer: Self-pay

## 2019-09-21 VITALS — BP 130/85 | Ht 65.5 in | Wt 167.0 lb

## 2019-09-21 DIAGNOSIS — Z3009 Encounter for other general counseling and advice on contraception: Secondary | ICD-10-CM | POA: Diagnosis not present

## 2019-09-21 DIAGNOSIS — Z30013 Encounter for initial prescription of injectable contraceptive: Secondary | ICD-10-CM | POA: Insufficient documentation

## 2019-09-21 DIAGNOSIS — Z3042 Encounter for surveillance of injectable contraceptive: Secondary | ICD-10-CM

## 2019-09-21 MED ORDER — MEDROXYPROGESTERONE ACETATE 150 MG/ML IM SUSP
150.0000 mg | Freq: Once | INTRAMUSCULAR | Status: AC
  Administered 2019-09-21: 150 mg via INTRAMUSCULAR

## 2019-09-21 NOTE — Progress Notes (Addendum)
Pt is 11.0 weeks post depo today.  DMPA 150 mg IM given per Arnetha Courser, CNM order dated 07/06/19.

## 2019-10-19 ENCOUNTER — Emergency Department: Payer: BLUE CROSS/BLUE SHIELD

## 2019-10-19 ENCOUNTER — Other Ambulatory Visit: Payer: Self-pay

## 2019-10-19 ENCOUNTER — Emergency Department
Admission: EM | Admit: 2019-10-19 | Discharge: 2019-10-19 | Disposition: A | Discharge status: Home / Self Care | Payer: BLUE CROSS/BLUE SHIELD | Attending: Emergency Medicine | Admitting: Emergency Medicine

## 2019-10-19 DIAGNOSIS — Y999 Unspecified external cause status: Secondary | ICD-10-CM | POA: Insufficient documentation

## 2019-10-19 DIAGNOSIS — Y939 Activity, unspecified: Secondary | ICD-10-CM | POA: Diagnosis not present

## 2019-10-19 DIAGNOSIS — X58XXXA Exposure to other specified factors, initial encounter: Secondary | ICD-10-CM | POA: Insufficient documentation

## 2019-10-19 DIAGNOSIS — S39012A Strain of muscle, fascia and tendon of lower back, initial encounter: Secondary | ICD-10-CM | POA: Insufficient documentation

## 2019-10-19 DIAGNOSIS — Y929 Unspecified place or not applicable: Secondary | ICD-10-CM | POA: Diagnosis not present

## 2019-10-19 DIAGNOSIS — S34109A Unspecified injury to unspecified level of lumbar spinal cord, initial encounter: Secondary | ICD-10-CM | POA: Diagnosis present

## 2019-10-19 MED ORDER — KETOROLAC TROMETHAMINE 30 MG/ML IJ SOLN
30.0000 mg | Freq: Once | INTRAMUSCULAR | Status: AC
  Administered 2019-10-19: 30 mg via INTRAMUSCULAR
  Filled 2019-10-19: qty 1

## 2019-10-19 MED ORDER — METHOCARBAMOL 500 MG PO TABS: 500.0000 mg | ORAL_TABLET | Freq: Four times a day (QID) | ORAL | 0 refills | Status: DC

## 2019-10-19 MED ORDER — ORPHENADRINE CITRATE 30 MG/ML IJ SOLN
60.0000 mg | Freq: Once | INTRAMUSCULAR | Status: AC
  Administered 2019-10-19: 60 mg via INTRAMUSCULAR
  Filled 2019-10-19: qty 2

## 2019-10-19 MED ORDER — MELOXICAM 15 MG PO TABS: 15.0000 mg | ORAL_TABLET | Freq: Every day | ORAL | 0 refills | Status: DC

## 2019-10-19 NOTE — ED Triage Notes (Signed)
Pt here with low back pain that started yesterday Pt states that she picked up a heavy box. Pt took aleve and a [employer] powder and a heating pad with no relief.

## 2019-10-19 NOTE — ED Provider Notes (Signed)
[facility] Emergency Department Provider Note  ____________________________________________  Time seen: Approximately 10:14 PM  I have reviewed the triage vital signs and the nursing notes.   HISTORY  Chief Complaint Back Pain    HPI Rose Ray is a 34 y.o. female who presents the emergency department complaining of left-sided lower back pain.  Patient was lifting a heavy box the other day when she felt a pulling sensation.  Since then she has had pain in the left side of the back extending into the left leg.  No bowel or bladder dysfunction, saddle anesthesia or paresthesias.  No history of chronic back issues.  No direct trauma to the spine.  No urinary or GI complaints.         No past medical history on file.  Patient Active Problem List   Diagnosis Date Noted  . Encounter for initial prescription of injectable contraceptive 09/21/2019  . Overweight with body mass index BMI=27.5 09/12/2017    No past surgical history on file.  Prior to Admission medications   Medication Sig Start Date End Date Taking? Authorizing Provider  cetirizine (ZYRTEC) 5 MG tablet Take 5 mg by mouth daily.    [provider]  Ibuprofen (ADVIL MIGRAINE PO) Take 1 tablet by mouth.    [provider]  meloxicam (MOBIC) 15 MG tablet Take 1 tablet (15 mg total) by mouth daily. 10/19/19   Kiyra Slaubaugh, Delorise Royals, PA-C  methocarbamol (ROBAXIN) 500 MG tablet Take 1 tablet (500 mg total) by mouth 4 (four) times daily. 10/19/19   Avice Funchess, Delorise Royals, PA-C    Allergies Amoxicillin  Family History  Problem Relation Age of Onset  . Hypertension Mother   . Migraines Father   . Migraines Sister   . Hypertension Sister   . Migraines Daughter   . Hypertension Maternal Grandmother   . Diabetes Maternal Grandmother   . Hypertension Maternal Grandfather   . Diabetes Maternal Grandfather     Social History Social History   Tobacco Use  . Smoking status:  Never Smoker  . Smokeless tobacco: Never Used  Substance Use Topics  . Alcohol use: No  . Drug use: No     Review of Systems  Constitutional: No fever/chills Eyes: No visual changes. No discharge ENT: No upper respiratory complaints. Cardiovascular: no chest pain. Respiratory: no cough. No SOB. Gastrointestinal: No abdominal pain.  No nausea, no vomiting.  No diarrhea.  No constipation. Genitourinary: Negative for dysuria. No hematuria Musculoskeletal: Positive for left-sided lower back pain Skin: Negative for rash, abrasions, lacerations, ecchymosis. Neurological: Negative for headaches, focal weakness or numbness. 10-point ROS otherwise negative.  ____________________________________________   PHYSICAL EXAM:  VITAL SIGNS: ED Triage Vitals [10/19/19 1758]  Enc Vitals Group     BP (!) 151/78     Pulse Rate 81     Resp 18     Temp 98.5 F (36.9 C)     Temp Source Oral     SpO2 100 %     Weight 167 lb (75.8 kg)     Height 5\' 5"  (1.651 m)     Head Circumference      Peak Flow      Pain Score 10     Pain Loc      Pain Edu?      Excl. in GC?      Constitutional: Alert and oriented. Well appearing and in no acute distress. Eyes: Conjunctivae are normal. PERRL. EOMI. Head: Atraumatic. ENT:  Ears:       Nose: No congestion/rhinnorhea.      Mouth/Throat: Mucous membranes are moist.  Neck: No stridor.    Cardiovascular: Normal rate, regular rhythm. Normal S1 and S2.  Good peripheral circulation. Respiratory: Normal respiratory effort without tachypnea or retractions. Lungs CTAB. Good air entry to the bases with no decreased or absent breath sounds. Gastrointestinal: Bowel sounds 4 quadrants. Soft and nontender to palpation. No guarding or rigidity. No palpable masses. No distention. No CVA tenderness. Musculoskeletal: Full range of motion to all extremities. No gross deformities appreciated.  Visualization of the left side of the back reveals no visible signs of  trauma.  Patient is nontender palpation midline and right paraspinal muscle group.  Patient is very tender to palpation with palpable spasms appreciated in the lumbar paraspinal muscle group.  Slight tenderness extending into the SI joint.  Tender over the sciatic notch left side.  Negative straight leg raise bilaterally.  Dorsalis pedis pulses sensation intact bilateral lower extremities. Neurologic:  Normal speech and language. No gross focal neurologic deficits are appreciated.  Skin:  Skin is warm, dry and intact. No rash noted. Psychiatric: Mood and affect are normal. Speech and behavior are normal. Patient exhibits appropriate insight and judgement.   ____________________________________________   LABS (all labs ordered are listed, but only abnormal results are displayed)  Labs Reviewed - No data to display ____________________________________________  EKG   ____________________________________________  RADIOLOGY I personally viewed and evaluated these images as part of my medical decision making, as well as reviewing the written report by the radiologist.  DG Lumbar Spine Complete  Result Date: 10/19/2019 CLINICAL DATA:  Back pain EXAM: LUMBAR SPINE - COMPLETE 4+ VIEW COMPARISON:  None. FINDINGS: There is no evidence of lumbar spine fracture. Alignment is normal. Intervertebral disc spaces are maintained. IMPRESSION: Negative. Electronically Signed   By: Jasmine Pang M.D.   On: 10/19/2019 18:24    ____________________________________________    PROCEDURES  Procedure(s) performed:    Procedures    Medications  ketorolac (TORADOL) 30 MG/ML injection 30 mg (30 mg Intramuscular Given 10/19/19 2225)  orphenadrine (NORFLEX) injection 60 mg (60 mg Intramuscular Given 10/19/19 2230)     ____________________________________________   INITIAL IMPRESSION / ASSESSMENT AND PLAN / ED COURSE  Pertinent labs & imaging results that were available during my care of the patient  were reviewed by me and considered in my medical decision making (see chart for details).  Review of the [place] CSRS was performed in accordance of the [facility] prior to dispensing any controlled drugs.           Patient's diagnosis is consistent with lumbar strain.  Patient presented to emergency department complaining of lower back pain after lifting a heavy box.  Findings are consistent with lumbar strain.  Imaging was reassuring with no acute findings.  Exam is reassuring with no neuro deficits.  Patient will be prescribed anti-inflammatory muscle relaxer for symptom relief.  No indication for further work-up at this time.  Follow-up primary care as needed..  Patient is given ED precautions to return to the ED for any worsening or new symptoms.     ____________________________________________  FINAL CLINICAL IMPRESSION(S) / ED DIAGNOSES  Final diagnoses:  Strain of lumbar region, initial encounter      NEW MEDICATIONS STARTED DURING THIS VISIT:  ED Discharge Orders         Ordered    meloxicam (MOBIC) 15 MG tablet  Daily  10/19/19 2215    methocarbamol (ROBAXIN) 500 MG tablet  4 times daily        10/19/19 2215              This chart was dictated using voice recognition software/Dragon. Despite best efforts to proofread, errors can occur which can change the meaning. Any change was purely unintentional.    Racheal Patches, PA-C 10/19/19 2250    Gilles Chiquito, MD 10/19/19 [phone]

## 2019-12-09 ENCOUNTER — Ambulatory Visit: Payer: BLUE CROSS/BLUE SHIELD

## 2019-12-14 ENCOUNTER — Ambulatory Visit (LOCAL_COMMUNITY_HEALTH_CENTER): Payer: BLUE CROSS/BLUE SHIELD

## 2019-12-14 ENCOUNTER — Other Ambulatory Visit: Payer: Self-pay

## 2019-12-14 VITALS — BP 124/79 | Ht 65.5 in | Wt 167.5 lb

## 2019-12-14 DIAGNOSIS — Z3009 Encounter for other general counseling and advice on contraception: Secondary | ICD-10-CM | POA: Diagnosis not present

## 2019-12-14 DIAGNOSIS — Z30013 Encounter for initial prescription of injectable contraceptive: Secondary | ICD-10-CM

## 2019-12-14 DIAGNOSIS — Z3042 Encounter for surveillance of injectable contraceptive: Secondary | ICD-10-CM

## 2019-12-14 MED ORDER — MEDROXYPROGESTERONE ACETATE 150 MG/ML IM SUSP
150.0000 mg | Freq: Once | INTRAMUSCULAR | Status: AC
  Administered 2019-12-14: 150 mg via INTRAMUSCULAR

## 2019-12-14 NOTE — Progress Notes (Signed)
Pt is 12.0 weeks post depo today. DMPA 150 mg IM administered today per Arnetha Courser, CNM order dated 07/06/19.

## 2020-03-07 ENCOUNTER — Other Ambulatory Visit: Payer: Self-pay

## 2020-03-07 ENCOUNTER — Ambulatory Visit (LOCAL_COMMUNITY_HEALTH_CENTER): Payer: Self-pay

## 2020-03-07 VITALS — BP 131/87 | Ht 65.5 in | Wt 166.5 lb

## 2020-03-07 DIAGNOSIS — Z3009 Encounter for other general counseling and advice on contraception: Secondary | ICD-10-CM

## 2020-03-07 DIAGNOSIS — Z3042 Encounter for surveillance of injectable contraceptive: Secondary | ICD-10-CM

## 2020-03-07 MED ORDER — MEDROXYPROGESTERONE ACETATE 150 MG/ML IM SUSP
150.0000 mg | Freq: Once | INTRAMUSCULAR | Status: AC
  Administered 2020-03-07: 150 mg via INTRAMUSCULAR

## 2020-03-07 NOTE — Progress Notes (Signed)
12 weeks post depo. Voices no concerns. DMPA 150mg  IM given Left UOQ without problem based on order by , CNM dated 07/06/2019. Next depo due 05/23/2020, pt aware. 05/25/2020, RN

## 2020-03-10 ENCOUNTER — Other Ambulatory Visit: Payer: Self-pay

## 2020-03-10 ENCOUNTER — Emergency Department
Admission: EM | Admit: 2020-03-10 | Discharge: 2020-03-10 | Disposition: A | Discharge status: Home / Self Care | Payer: HRSA Program | Attending: Emergency Medicine | Admitting: Emergency Medicine

## 2020-03-10 DIAGNOSIS — R519 Headache, unspecified: Secondary | ICD-10-CM | POA: Diagnosis present

## 2020-03-10 DIAGNOSIS — U071 COVID-19: Secondary | ICD-10-CM | POA: Insufficient documentation

## 2020-03-10 DIAGNOSIS — J0100 Acute maxillary sinusitis, unspecified: Secondary | ICD-10-CM | POA: Insufficient documentation

## 2020-03-10 LAB — SARS CORONAVIRUS 2 (TAT 6-24 HRS): SARS Coronavirus 2: POSITIVE — AB

## 2020-03-10 MED ORDER — ACETAMINOPHEN 325 MG PO TABS
650.0000 mg | ORAL_TABLET | Freq: Once | ORAL | Status: AC
  Administered 2020-03-10: 650 mg via ORAL
  Filled 2020-03-10: qty 2

## 2020-03-10 MED ORDER — CETIRIZINE-PSEUDOEPHEDRINE ER 5-120 MG PO TB12: 1.0000 | ORAL_TABLET | Freq: Two times a day (BID) | ORAL | 0 refills | Status: DC

## 2020-03-10 MED ORDER — AZITHROMYCIN 250 MG PO TABS: ORAL_TABLET | ORAL | 0 refills | Status: AC

## 2020-03-10 NOTE — ED Provider Notes (Signed)
[facility] Emergency Department Provider Note   ____________________________________________   Event Date/Time   First MD Initiated Contact with Patient 03/10/20 0825     (approximate)  I have reviewed the triage vital signs and the nursing notes.   HISTORY  Chief Complaint Headache    HPI Rose Ray is a 35 y.o. female patient presents with frontal headache and sinus pressure.  Patient states headache started yesterday.  Sinus congestion is greater than 1 week.  Patient has a history of seasonal rhinitis.  Patient denies recent travel or known contact with COVID-19.  Patient has not taken the COVID-19 vaccine or flu shot.  Patient rates the pain as a 10/10.  Patient described pain as "pressure".  No palliative measure for complaint.         History reviewed. No pertinent past medical history.  Patient Active Problem List   Diagnosis Date Noted  . Encounter for initial prescription of injectable contraceptive 09/21/2019  . Overweight with body mass index BMI=27.5 09/12/2017    History reviewed. No pertinent surgical history.  Prior to Admission medications   Medication Sig Start Date End Date Taking? Authorizing Provider  azithromycin (ZITHROMAX Z-PAK) 250 MG tablet Take 2 tablets (500 mg) on  Day 1,  followed by 1 tablet (250 mg) once daily on Days 2 through 5. 03/10/20 03/15/20 Yes Joni Reining, PA-C  cetirizine-pseudoephedrine (ZYRTEC-D ALLERGY & CONGESTION) 5-120 MG tablet Take 1 tablet by mouth 2 (two) times daily. 03/10/20  Yes Joni Reining, PA-C  cetirizine (ZYRTEC) 5 MG tablet Take 5 mg by mouth daily. Patient not taking: Reported on 12/14/2019    [provider]  Ibuprofen (ADVIL MIGRAINE PO) Take 1 tablet by mouth. Patient not taking: Reported on 12/14/2019    [provider]  meloxicam (MOBIC) 15 MG tablet Take 1 tablet (15 mg total) by mouth daily. Patient not taking: Reported on 12/14/2019 10/19/19    Cuthriell, Delorise Royals, PA-C  methocarbamol (ROBAXIN) 500 MG tablet Take 1 tablet (500 mg total) by mouth 4 (four) times daily. Patient not taking: Reported on 12/14/2019 10/19/19   Cuthriell, Delorise Royals, PA-C    Allergies Amoxicillin  Family History  Problem Relation Age of Onset  . Hypertension Mother   . Migraines Father   . Migraines Sister   . Hypertension Sister   . Migraines Daughter   . Hypertension Maternal Grandmother   . Diabetes Maternal Grandmother   . Hypertension Maternal Grandfather   . Diabetes Maternal Grandfather     Social History Social History   Tobacco Use  . Smoking status: Never Smoker  . Smokeless tobacco: Never Used  Substance Use Topics  . Alcohol use: No  . Drug use: No    Review of Systems Constitutional: No fever/chills Eyes: No visual changes. ENT: No sore throat.  Sinus pressure. Cardiovascular: Denies chest pain. Respiratory: Denies shortness of breath. Gastrointestinal: No abdominal pain.  No nausea, no vomiting.  No diarrhea.  No constipation. Genitourinary: Negative for dysuria. Musculoskeletal: Negative for back pain. Skin: Negative for rash. Neurological: Positive for frontal headache, but denies focal weakness or numbness. Allergic/Immunilogical: Amoxicillin. ____________________________________________   PHYSICAL EXAM:  VITAL SIGNS: ED Triage Vitals  Enc Vitals Group     BP 03/10/20 0753 (!) 143/73     Pulse Rate 03/10/20 0753 86     Resp 03/10/20 0753 18     Temp 03/10/20 0753 99 F (37.2 C)     Temp Source 03/10/20 0753  Oral     SpO2 03/10/20 0753 99 %     Weight 03/10/20 0754 166 lb (75.3 kg)     Height 03/10/20 0754 5' 5.5" (1.664 m)     Head Circumference --      Peak Flow --      Pain Score 03/10/20 0754 10     Pain Loc --      Pain Edu? --      Excl. in GC? --     Constitutional: Alert and oriented. Well appearing and in no acute distress. Eyes: Conjunctivae are normal. PERRL. EOMI. Head:  Atraumatic. Nose: Bilateral frontal and maxillary guarding.  Edematous nasal turbinates with thick rhinorrhea. Mouth/Throat: Mucous membranes are moist.  Oropharynx non-erythematous.  Postnasal drainage. Neck: No stridor.  Hematological/Lymphatic/Immunilogical: No cervical lymphadenopathy. Cardiovascular: Normal rate, regular rhythm. Grossly normal heart sounds.  Good peripheral circulation. Respiratory: Normal respiratory effort.  No retractions. Lungs CTAB. Genitourinary: Deferred Skin:  Skin is warm, dry and intact. No rash noted. Psychiatric: Mood and affect are normal. Speech and behavior are normal.  ____________________________________________   LABS (all labs ordered are listed, but only abnormal results are displayed)  Labs Reviewed  SARS CORONAVIRUS 2 (TAT 6-24 HRS)   ____________________________________________  EKG   ____________________________________________  RADIOLOGY I, Joni Reining, personally viewed and evaluated these images (plain radiographs) as part of my medical decision making, as well as reviewing the written report by the radiologist.  ED MD interpretation:    Official radiology report(s): No results found.  ____________________________________________   PROCEDURES  Procedure(s) performed (including Critical Care):  Procedures   ____________________________________________   INITIAL IMPRESSION / ASSESSMENT AND PLAN / ED COURSE  As part of my medical decision making, I reviewed the following data within the electronic MEDICAL RECORD NUMBER         Patient presents with frontal headache, maxillary guarding, thick rhinorrhea.  Patient complaining physical exam is consistent with subacute maxillary sinusitis.  Patient given discharge care instruction advised take medication as directed.  Patient advised to self quarantine pending results of the COVID-19 test.  Test may review later in MyChart app.       ____________________________________________   FINAL CLINICAL IMPRESSION(S) / ED DIAGNOSES  Final diagnoses:  Subacute maxillary sinusitis  Sinus headache     ED Discharge Orders         Ordered    azithromycin (ZITHROMAX Z-PAK) 250 MG tablet        03/10/20 0832    cetirizine-pseudoephedrine (ZYRTEC-D ALLERGY & CONGESTION) 5-120 MG tablet  2 times daily        03/10/20 9509          *Please note:  SARAHI BORLAND was evaluated in Emergency Department on 03/10/2020 for the symptoms described in the history of present illness. She was evaluated in the context of the global COVID-19 pandemic, which necessitated consideration that the patient might be at risk for infection with the SARS-CoV-2 virus that causes COVID-19. Institutional protocols and algorithms that pertain to the evaluation of patients at risk for COVID-19 are in a state of rapid change based on information released by regulatory bodies including the CDC and federal and state organizations. These policies and algorithms were followed during the patient's care in the ED.  Some ED evaluations and interventions may be delayed as a result of limited staffing during and the pandemic.*   Note:  This document was prepared using Dragon voice recognition software and may include unintentional dictation errors.  Kaleya, Douse, PA-C 03/10/20 9150    Jene Every, MD 03/10/20 0900

## 2020-03-10 NOTE — Discharge Instructions (Signed)
Advised self quarantine pending results of COVID-19 test.  Results can be found later today in the MyChart app.  If test is positive must quarantine additional 10 days.  Follow discharge care instruction take medication as directed.

## 2020-03-10 NOTE — ED Triage Notes (Addendum)
PT to ED via POV from home. PT c/o bilateral facial sinus area pain as well as headache and fever.  Sx since yesterday.

## 2020-04-19 ENCOUNTER — Emergency Department
Admission: EM | Admit: 2020-04-19 | Discharge: 2020-04-19 | Disposition: A | Discharge status: Home / Self Care | Payer: No Typology Code available for payment source | Attending: Emergency Medicine | Admitting: Emergency Medicine

## 2020-04-19 ENCOUNTER — Emergency Department: Payer: No Typology Code available for payment source

## 2020-04-19 ENCOUNTER — Encounter: Payer: Self-pay | Admitting: Emergency Medicine

## 2020-04-19 ENCOUNTER — Other Ambulatory Visit: Payer: Self-pay

## 2020-04-19 DIAGNOSIS — Y999 Unspecified external cause status: Secondary | ICD-10-CM | POA: Diagnosis not present

## 2020-04-19 DIAGNOSIS — S39012A Strain of muscle, fascia and tendon of lower back, initial encounter: Secondary | ICD-10-CM | POA: Diagnosis not present

## 2020-04-19 DIAGNOSIS — M546 Pain in thoracic spine: Secondary | ICD-10-CM | POA: Diagnosis not present

## 2020-04-19 DIAGNOSIS — S161XXA Strain of muscle, fascia and tendon at neck level, initial encounter: Secondary | ICD-10-CM | POA: Insufficient documentation

## 2020-04-19 DIAGNOSIS — Y9389 Activity, other specified: Secondary | ICD-10-CM | POA: Diagnosis not present

## 2020-04-19 DIAGNOSIS — S3992XA Unspecified injury of lower back, initial encounter: Secondary | ICD-10-CM | POA: Diagnosis not present

## 2020-04-19 DIAGNOSIS — Y92410 Unspecified street and highway as the place of occurrence of the external cause: Secondary | ICD-10-CM | POA: Insufficient documentation

## 2020-04-19 DIAGNOSIS — S199XXA Unspecified injury of neck, initial encounter: Secondary | ICD-10-CM | POA: Diagnosis present

## 2020-04-19 MED ORDER — METHOCARBAMOL 750 MG PO TABS: 750.0000 mg | ORAL_TABLET | Freq: Four times a day (QID) | ORAL | 0 refills | Status: AC | PRN

## 2020-04-19 MED ORDER — ACETAMINOPHEN 325 MG PO TABS
650.0000 mg | ORAL_TABLET | Freq: Once | ORAL | Status: AC
  Administered 2020-04-19: 650 mg via ORAL
  Filled 2020-04-19: qty 2

## 2020-04-19 MED ORDER — KETOROLAC TROMETHAMINE 60 MG/2ML IM SOLN
30.0000 mg | Freq: Once | INTRAMUSCULAR | Status: AC
  Administered 2020-04-19: 30 mg via INTRAMUSCULAR
  Filled 2020-04-19: qty 2

## 2020-04-19 MED ORDER — KETOROLAC TROMETHAMINE 10 MG PO TABS: 10.0000 mg | ORAL_TABLET | Freq: Four times a day (QID) | ORAL | 0 refills | Status: AC | PRN
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
## 2020-04-19 NOTE — Discharge Instructions (Addendum)
Please take Toradol tablets, up to 4 times daily and up to 5 days. Please do not use past 5 days. You may use Robaxin, the muscle relaxer up to 4 times daily as needed, however please do not use this before driving or operating machinery. Please combine this with Tylenol, up to 1000 mg 4 times daily as needed for pain. Please follow-up with primary care if not improved in 2 weeks. Return to the emergency department for any worsening.
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
## 2020-05-30 ENCOUNTER — Other Ambulatory Visit: Payer: Self-pay

## 2020-05-30 ENCOUNTER — Ambulatory Visit (LOCAL_COMMUNITY_HEALTH_CENTER): Payer: BLUE CROSS/BLUE SHIELD

## 2020-05-30 VITALS — BP 129/85 | Ht 65.5 in | Wt 159.0 lb

## 2020-05-30 DIAGNOSIS — Z3009 Encounter for other general counseling and advice on contraception: Secondary | ICD-10-CM

## 2020-05-30 DIAGNOSIS — Z3042 Encounter for surveillance of injectable contraceptive: Secondary | ICD-10-CM | POA: Diagnosis not present

## 2020-05-30 MED ORDER — MEDROXYPROGESTERONE ACETATE 150 MG/ML IM SUSP
150.0000 mg | Freq: Once | INTRAMUSCULAR | Status: AC
  Administered 2020-05-30: 150 mg via INTRAMUSCULAR

## 2020-05-30 NOTE — Progress Notes (Signed)
12 weeks post depo. Voices no concerns. Depo given today per order by Hazle Coca, CNM dated 07/06/2019. Tolerated well in RUOQ. Physical due at next depo, approx. 08/15/2020, pt has reminder. Jerel Shepherd, RN

## 2020-08-15 ENCOUNTER — Other Ambulatory Visit: Payer: Self-pay

## 2020-08-15 ENCOUNTER — Ambulatory Visit (LOCAL_COMMUNITY_HEALTH_CENTER): Payer: BLUE CROSS/BLUE SHIELD | Admitting: Advanced Practice Midwife

## 2020-08-15 ENCOUNTER — Encounter: Payer: Self-pay | Admitting: Advanced Practice Midwife

## 2020-08-15 VITALS — BP 122/82 | Ht 65.5 in | Wt 155.0 lb

## 2020-08-15 DIAGNOSIS — G43909 Migraine, unspecified, not intractable, without status migrainosus: Secondary | ICD-10-CM

## 2020-08-15 DIAGNOSIS — Z3009 Encounter for other general counseling and advice on contraception: Secondary | ICD-10-CM

## 2020-08-15 DIAGNOSIS — Z3042 Encounter for surveillance of injectable contraceptive: Secondary | ICD-10-CM

## 2020-08-15 DIAGNOSIS — A599 Trichomoniasis, unspecified: Secondary | ICD-10-CM

## 2020-08-15 LAB — WET PREP FOR TRICH, YEAST, CLUE
Trichomonas Exam: POSITIVE — AB
Yeast Exam: NEGATIVE

## 2020-08-15 MED ORDER — METRONIDAZOLE 500 MG PO TABS: 500.0000 mg | ORAL_TABLET | Freq: Two times a day (BID) | ORAL | 0 refills | Status: AC

## 2020-08-15 MED ORDER — MEDROXYPROGESTERONE ACETATE 150 MG/ML IM SUSP
150.0000 mg | INTRAMUSCULAR | Status: AC
  Administered 2020-08-15 – 2021-04-06 (×4): 150 mg via INTRAMUSCULAR

## 2020-08-15 NOTE — Progress Notes (Signed)
[facility] DEPARTMENT [facility] [address] Main Number: [phone]    Family Planning Visit- Initial Visit  Subjective:  Rose Ray is a 35 y.o. engaged WF nonsmoker [id number]   being seen today for an initial annual visit and to discuss contraceptive options.  The patient is currently using Depo Provera for pregnancy prevention. Patient reports she does not want a pregnancy in the next year.  Patient has the following medical conditions has Overweight with body mass index BMI=27.5 and Encounter for initial prescription of injectable contraceptive on their problem list.  Chief Complaint  Patient presents with   Annual Exam   Contraception    Depo (in hip)    Patient reports last DMPA 05/30/20. Last PE 07/06/19. Last pap 06/12/15 neg HPV neg. LMP not on DMPA. Last sex 08/08/20 without condom; with current partner x 11 years; 1 partner in last 3 mo. Last ETOH 07/21/20 (1 [employer]) "not often". Living with her daughter, grandson, fiance. Employed 35-40 hrs/wk.  Patient denies cigs, vaping, cigars, MJ  Body mass index is 25.4 kg/m. - Patient is eligible for diabetes screening based on BMI and age >66?  not applicable HA1C ordered? not applicable  Patient reports 1  partner/s in last year. Desires STI screening?  Yes  Has patient been screened once for HCV in the past?  No  No results found for: HCVAB  Does the patient have current drug use (including MJ), have a partner with drug use, and/or has been incarcerated since last result? No  If yes-- Screen for HCV through [facility]   Does the patient meet criteria for HBV testing? No  Criteria:  -Household, sexual or needle sharing contact with HBV -History of drug use -HIV positive -Those with known Hep C   Health Maintenance Due  Topic Date Due   COVID-19 Vaccine (1) Never done   HIV Screening  Never done   Hepatitis C Screening  Never done   TETANUS/TDAP  Never done   PAP  SMEAR-Modifier  06/12/2018    Review of Systems  Neurological:  Positive for headaches (2x/mo always base of neck relieved with Advil migraine and sleep).  All other systems reviewed and are negative.  The following portions of the patient's history were reviewed and updated as appropriate: allergies, current medications, past family history, past medical history, past social history, past surgical history and problem list. Problem list updated.   See flowsheet for other program required questions.  Objective:   Vitals:   08/15/20 1517  BP: 122/82  Weight: 155 lb (70.3 kg)  Height: 5' 5.5" (1.664 m)    Physical Exam Constitutional:      Appearance: Normal appearance. She is normal weight.  HENT:     Head: Normocephalic and atraumatic.     Mouth/Throat:     Mouth: Mucous membranes are moist.     Comments: Last dental exam last year; encouraged exam asap Eyes:     Conjunctiva/sclera: Conjunctivae normal.  Neck:     Thyroid: No thyroid mass, thyromegaly or thyroid tenderness.  Cardiovascular:     Rate and Rhythm: Normal rate and regular rhythm.  Pulmonary:     Effort: Pulmonary effort is normal.     Breath sounds: Normal breath sounds.  Chest:  Breasts:    Right: Normal.     Left: Normal.  Abdominal:     Palpations: Abdomen is soft.     Comments: Soft without masses or tenderness  Genitourinary:    General: Normal vulva.     Exam position: Lithotomy position.     Vagina: Vaginal discharge (white creamy leukorrhea, ph<4.5) present.     Cervix: Friability (to pap) present.     Uterus: Normal.      Adnexa: Right adnexa normal and left adnexa normal.     Rectum: Normal.  Musculoskeletal:        General: Normal range of motion.     Cervical back: Normal range of motion and neck supple.  Skin:    General: Skin is warm and dry.  Neurological:     Mental Status: She is alert.  Psychiatric:        Mood and Affect: Mood normal.      Assessment and Plan:  Rose Ray is a 35 y.o. female presenting to the [facility] Department for an initial annual wellness/contraceptive visit  Contraception counseling: Reviewed all forms of birth control options in the tiered based approach. available including abstinence; over the counter/barrier methods; hormonal contraceptive medication including pill, patch, ring, injection,contraceptive implant, ECP; hormonal and nonhormonal IUDs; permanent sterilization options including vasectomy and the various tubal sterilization modalities. Risks, benefits, and typical effectiveness rates were reviewed.  Questions were answered.  Written information was also given to the patient to review.  Patient desires DMPA, this was prescribed for patient. She will follow up in  11-13 wks for surveillance.  She was told to call with any further questions, or with any concerns about this method of contraception.  Emphasized use of condoms 100% of the time for STI prevention.  Patient was offered ECP. ECP was not accepted by the patient. ECP counseling was not given - see RN documentation  1. Family planning Treat wet mount per standing orders Immunization nurse consult - WET PREP FOR TRICH, YEAST, CLUE - Chlamydia/Gonorrhea [facility] - IGP, Aptima HPV  2. Encounter for surveillance of injectable contraceptive DMPA 150 mg IM q 11-13 wks x 1 year     No follow-ups on file.  No future appointments.  Alberteen Spindle, CNM

## 2020-08-15 NOTE — Progress Notes (Signed)
Pt here for PE and Depo.  Wet mount results reviewed, medication dispensed per SO.   Depo 150 mg given IM in LUOQ without any complications.  Berdie Ogren, RN

## 2020-08-15 NOTE — Progress Notes (Signed)
Pt to clinic for physical and depo. Pt is 11.0 weeks post depo today. 

## 2020-08-17 LAB — IGP, APTIMA HPV
HPV Aptima: NEGATIVE
PAP Smear Comment: 0

## 2020-11-02 ENCOUNTER — Ambulatory Visit: Payer: BLUE CROSS/BLUE SHIELD

## 2020-11-02 ENCOUNTER — Other Ambulatory Visit: Payer: Self-pay

## 2020-11-02 VITALS — BP 118/79 | Ht 65.5 in | Wt 160.5 lb

## 2020-11-02 DIAGNOSIS — Z3009 Encounter for other general counseling and advice on contraception: Secondary | ICD-10-CM

## 2020-11-02 DIAGNOSIS — Z3042 Encounter for surveillance of injectable contraceptive: Secondary | ICD-10-CM

## 2020-11-02 DIAGNOSIS — Z30013 Encounter for initial prescription of injectable contraceptive: Secondary | ICD-10-CM | POA: Diagnosis not present

## 2020-11-02 NOTE — Progress Notes (Signed)
DMPA 150 mg given IM (LUQ) today per 08/15/2020 order by Lawanna Kobus, NP and tolerated well. Patient is 11 w 2 d post last Depo. Hart Carwin, RN

## 2020-12-22 IMAGING — CR DG LUMBAR SPINE COMPLETE 4+V
1 series · 5 of 5 positions shown · non-contrast
Comparison: None.

CLINICAL DATA: Back pain

EXAM:
LUMBAR SPINE - COMPLETE 4+ VIEW

[Series 1: dg lumbar spine complete 4 +v · 0.14mm/px · 5 of 5 slices shown]
[im 1/5]
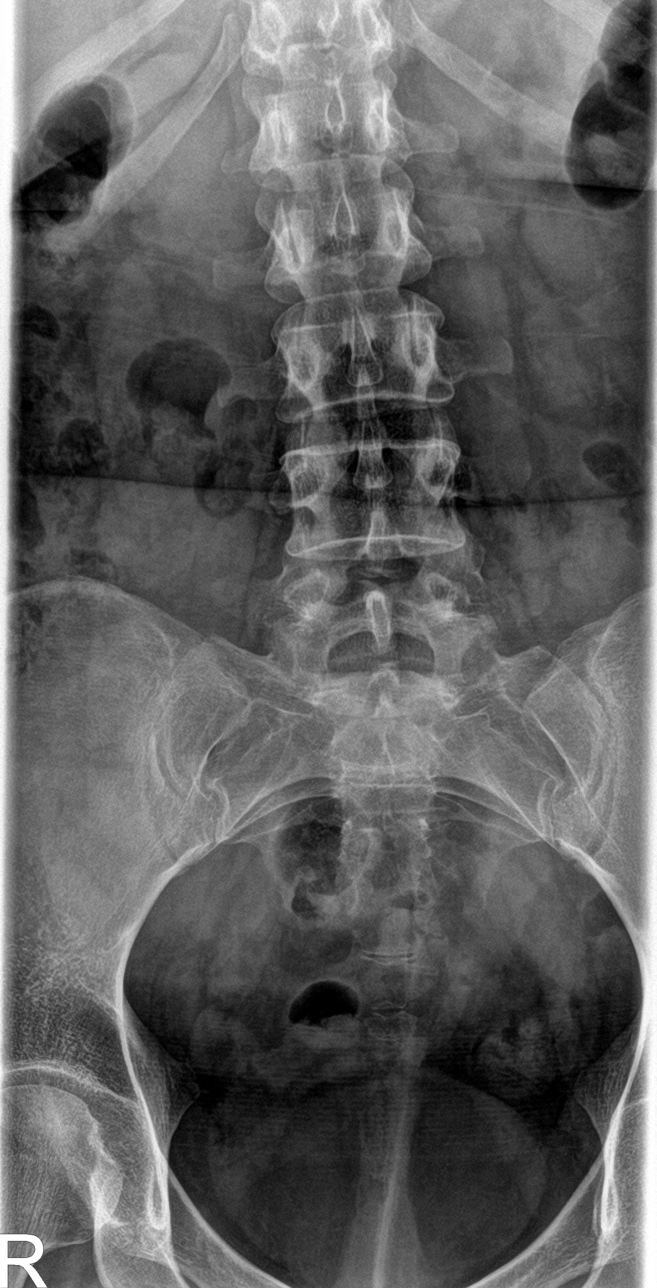
[im 2/5]
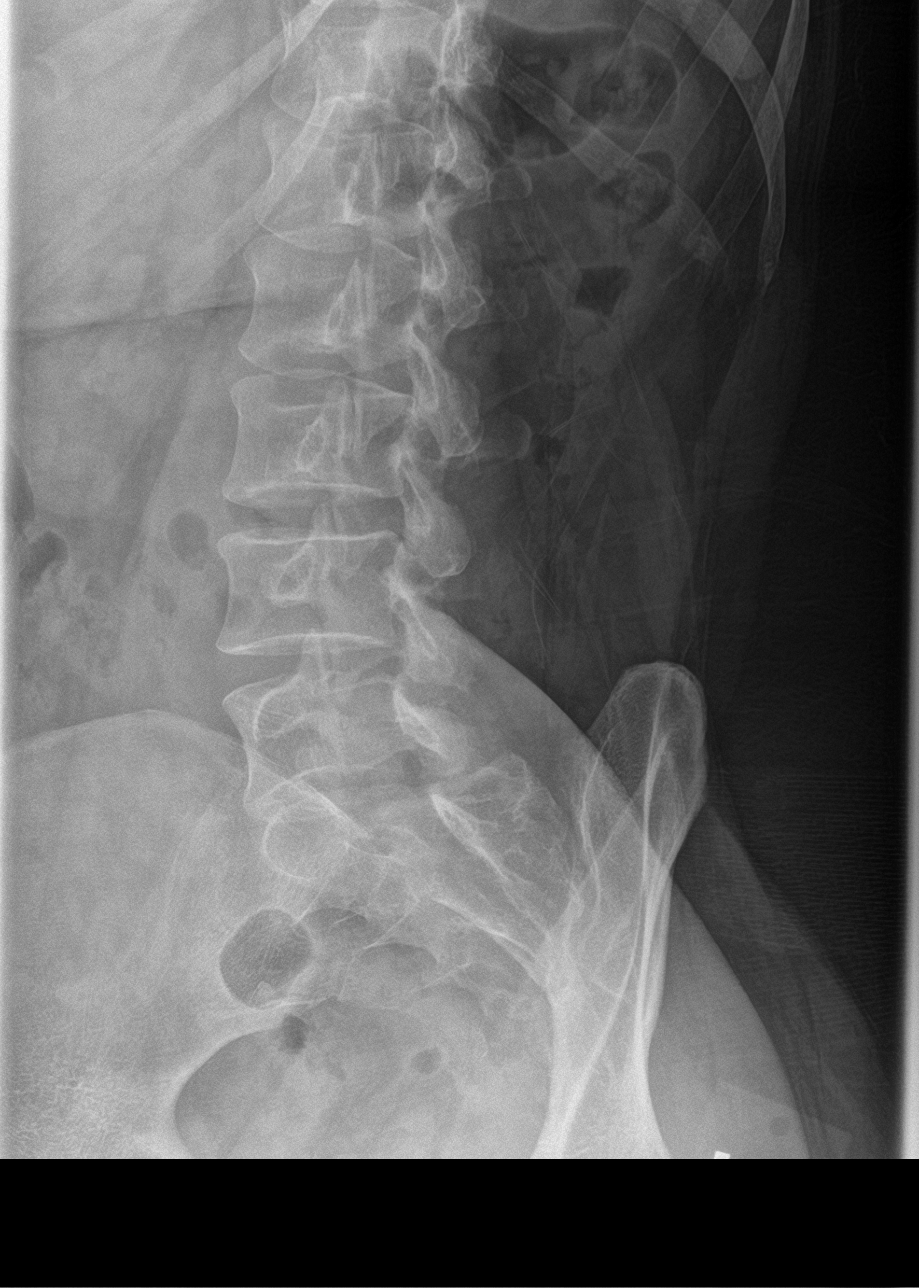
[im 3/5]
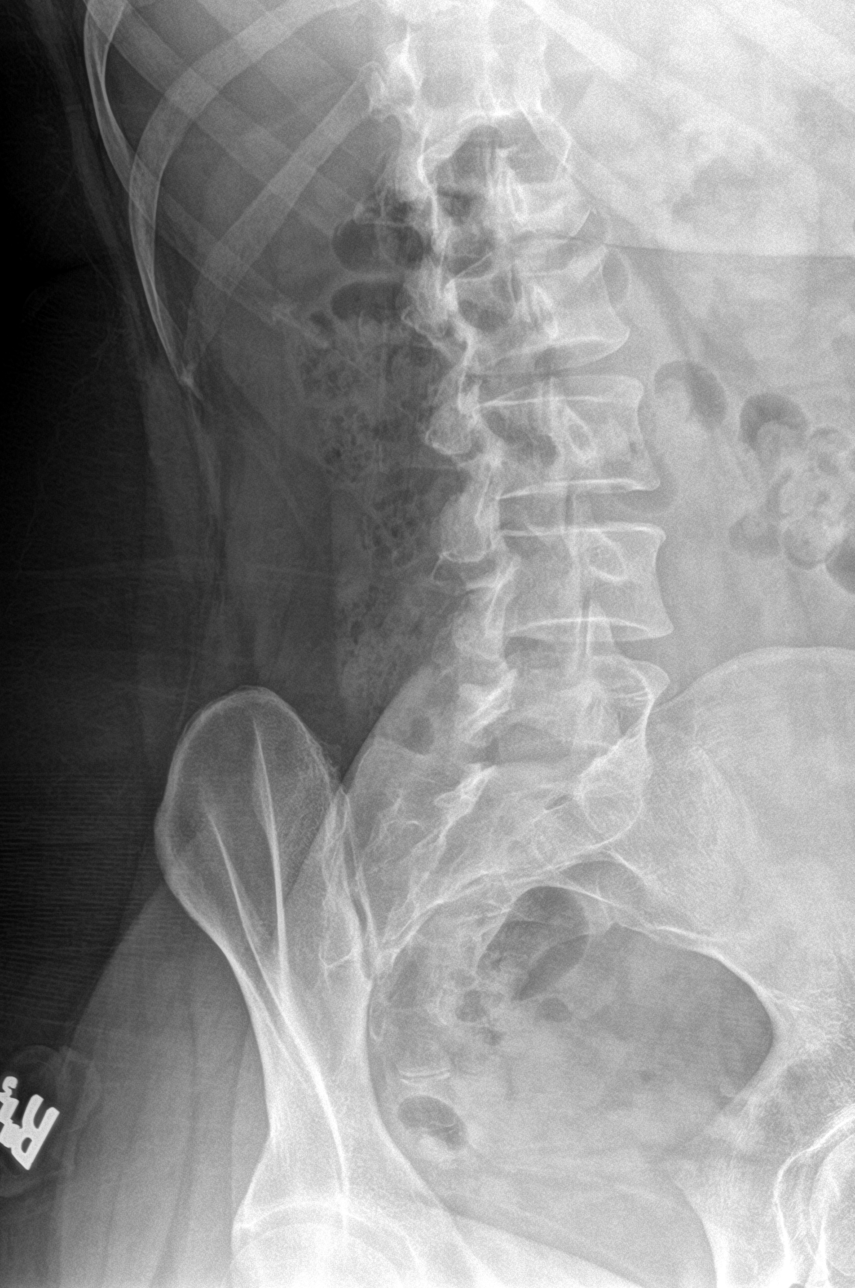
[im 4/5]
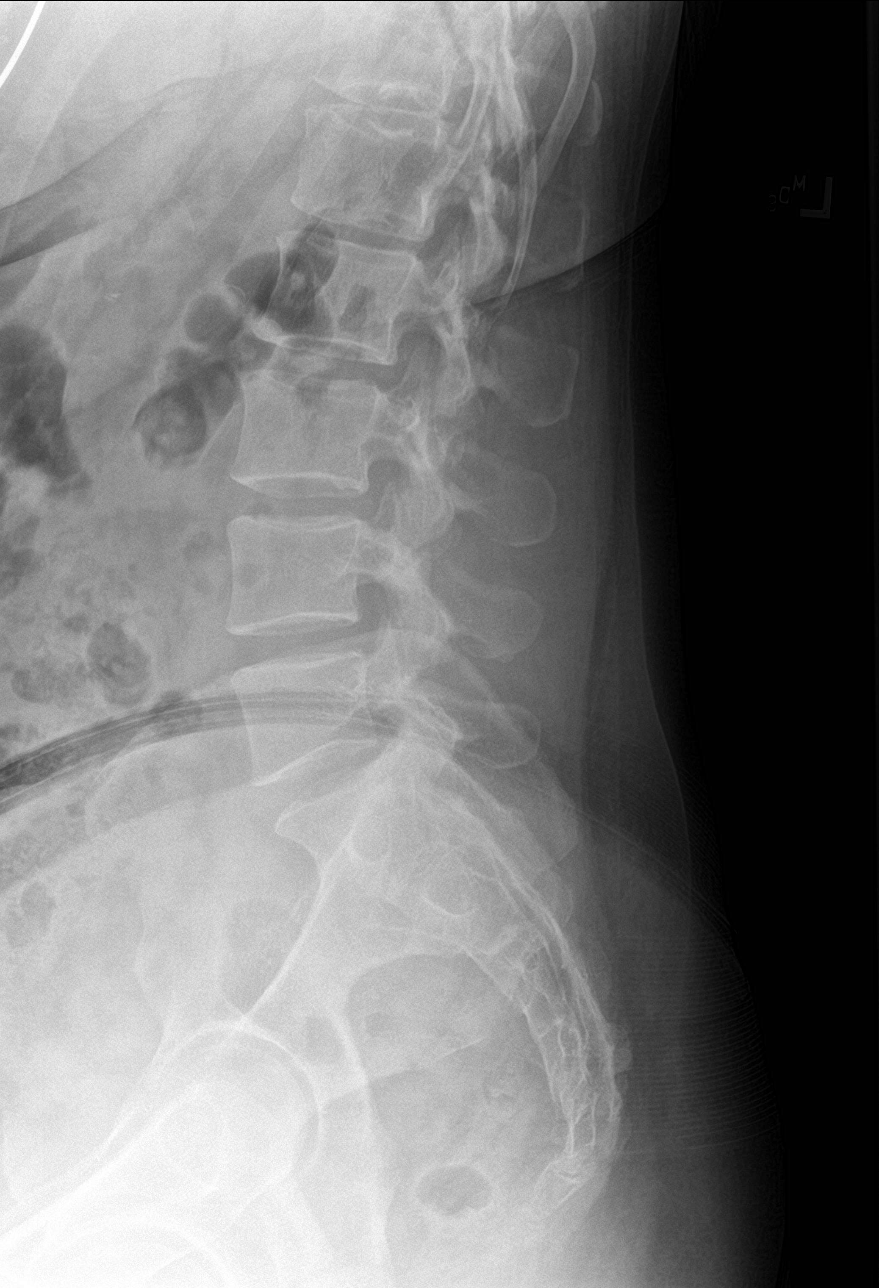
[im 5/5]
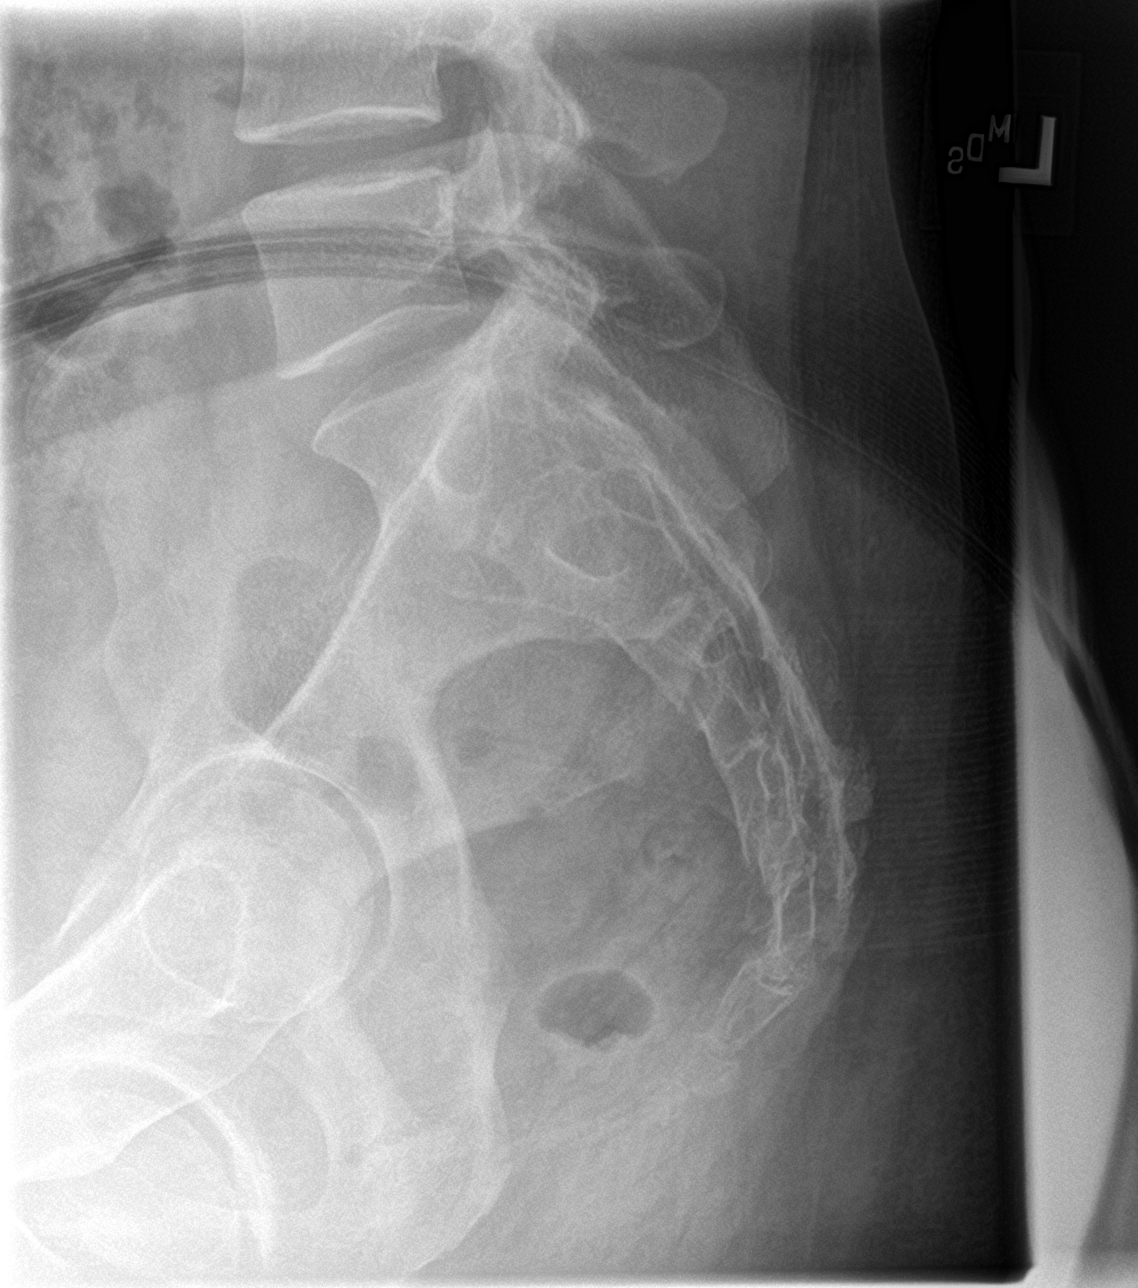

[5 of 5 positions shown; findings below may reference images not displayed]

FINDINGS: There is no evidence of lumbar spine fracture. Alignment is normal.
Intervertebral disc spaces are maintained.
IMPRESSION: Negative.

## 2021-01-17 ENCOUNTER — Ambulatory Visit (LOCAL_COMMUNITY_HEALTH_CENTER): Payer: BLUE CROSS/BLUE SHIELD

## 2021-01-17 ENCOUNTER — Other Ambulatory Visit: Payer: Self-pay

## 2021-01-17 VITALS — BP 127/84 | Ht 65.5 in | Wt 158.0 lb

## 2021-01-17 DIAGNOSIS — Z3042 Encounter for surveillance of injectable contraceptive: Secondary | ICD-10-CM

## 2021-01-17 DIAGNOSIS — Z3009 Encounter for other general counseling and advice on contraception: Secondary | ICD-10-CM | POA: Diagnosis not present

## 2021-01-17 NOTE — Progress Notes (Signed)
10 weeks 6 days post depo. Voices no concerns. Depo given today per order by Hazle Coca, CNM dated 08/15/2020. Tolerated well RUOQ. Depo consent signed today. Next depo due 04/04/2020, pt has reminder. Jerel Shepherd, RN

## 2021-04-06 ENCOUNTER — Ambulatory Visit (LOCAL_COMMUNITY_HEALTH_CENTER): Payer: BLUE CROSS/BLUE SHIELD

## 2021-04-06 ENCOUNTER — Other Ambulatory Visit: Payer: Self-pay

## 2021-04-06 VITALS — BP 134/86 | Ht 65.5 in | Wt 162.5 lb

## 2021-04-06 DIAGNOSIS — Z3009 Encounter for other general counseling and advice on contraception: Secondary | ICD-10-CM

## 2021-04-06 DIAGNOSIS — Z3042 Encounter for surveillance of injectable contraceptive: Secondary | ICD-10-CM | POA: Diagnosis not present

## 2021-04-06 NOTE — Progress Notes (Signed)
11 weeks 2 days post depo. Depo given today per order by Hazle Coca, CNM dated 08/15/2020. Tolerated well LUOQ. Next depo due 06/22/2021, pt aware. Jerel Shepherd, RN
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
## 2021-06-26 ENCOUNTER — Ambulatory Visit (LOCAL_COMMUNITY_HEALTH_CENTER): Payer: Self-pay

## 2021-06-26 VITALS — BP 130/90 | Ht 65.5 in | Wt 162.5 lb

## 2021-06-26 DIAGNOSIS — Z3042 Encounter for surveillance of injectable contraceptive: Secondary | ICD-10-CM

## 2021-06-26 DIAGNOSIS — Z3009 Encounter for other general counseling and advice on contraception: Secondary | ICD-10-CM | POA: Diagnosis not present

## 2021-06-26 MED ORDER — MEDROXYPROGESTERONE ACETATE 150 MG/ML IM SUSP
150.0000 mg | Freq: Once | INTRAMUSCULAR | Status: AC
  Administered 2021-06-26: 150 mg via INTRAMUSCULAR

## 2021-06-26 NOTE — Progress Notes (Signed)
11 weeks 4 days post depo.  Denies any complaints with method.  Depo given IM  RUOQ per order by Hazle Coca, CNM, dated 08/15/20 for 1 year; tolerated well.   ? ?Next depo due 09/11/21 and pt will need annual exam at that time; appt reminder given.  ? ?Bp 130/90 today;134/86 at 2/23 visit.  States mother and sister have HBP. Armando Gang, FNP, and instructed pt follow up with her PCP; pt agreed.  Says PCP is [employer]. ? ?Pt states she is scheduled for back surgery  08/02/21 at [facility].  ? ?Cherlynn Polo, RN   ?

## 2021-07-09 NOTE — Progress Notes (Signed)
Consulted by RN re: patient situation.  Reviewed RN note and agree that it reflects our discussion and my recommendations.  ° ° °Jazleen Robeck, FNP  °

## 2021-08-30 HISTORY — PX: CERVICAL SPINE SURGERY: SHX589

## 2021-09-19 ENCOUNTER — Ambulatory Visit (LOCAL_COMMUNITY_HEALTH_CENTER): Payer: Self-pay | Admitting: Advanced Practice Midwife

## 2021-09-19 ENCOUNTER — Encounter: Payer: Self-pay | Admitting: Advanced Practice Midwife

## 2021-09-19 VITALS — BP 128/83 | HR 74 | Temp 98.1°F | Wt 162.4 lb

## 2021-09-19 DIAGNOSIS — Z3042 Encounter for surveillance of injectable contraceptive: Secondary | ICD-10-CM

## 2021-09-19 DIAGNOSIS — A599 Trichomoniasis, unspecified: Secondary | ICD-10-CM | POA: Insufficient documentation

## 2021-09-19 DIAGNOSIS — Z3009 Encounter for other general counseling and advice on contraception: Secondary | ICD-10-CM

## 2021-09-19 DIAGNOSIS — F17200 Nicotine dependence, unspecified, uncomplicated: Secondary | ICD-10-CM

## 2021-09-19 DIAGNOSIS — M5030 Other cervical disc degeneration, unspecified cervical region: Secondary | ICD-10-CM

## 2021-09-19 MED ORDER — MEDROXYPROGESTERONE ACETATE 150 MG/ML IM SUSP
150.0000 mg | INTRAMUSCULAR | Status: AC
  Administered 2021-09-19 – 2022-05-29 (×4): 150 mg via INTRAMUSCULAR

## 2021-09-19 NOTE — Progress Notes (Signed)
[place] DEPARTMENT [facility] [address] Main Number: [phone]    Family Planning Visit- Initial Visit  Subjective:  Rose Ray is a 36 y.o. engaged WF exsmoker [id number] (28 yo daughter)  being seen today for an initial annual visit and to discuss reproductive life planning.  The patient is currently using Hormonal Injection for pregnancy prevention. Patient reports   does not want a pregnancy in the next year.     report they are looking for a method that provides High efficacy at preventing pregnancy  Patient has the following medical conditions has Overweight with body mass index BMI=27.5; Encounter for initial prescription of injectable contraceptive; Migraines dx'd 2018; and Trichomonas infection 08/15/20 on their problem list.  Chief Complaint  Patient presents with   Contraception    Here Annual Physical and Depo    Patient reports here for physical and DMPA. Last DMPA 06/26/21. LMP none with DMPA. Last physical 08/15/20. Last pap 08/15/20 neg HPV neg. Had surgery on cervical bulging disc 08/30/21. Last sex 08/27/21 without condom; with current partner x 13 years; 1 partner In last 3 mo. Last dental exam 02/2021. Last cig age 38. Last vaped age 59. Last ETOH 07/2021 (2 Smirnoff) q couple months. On medical disability until August after surgery. Living with her daughter and 2 yo grandson.  Patient denies cigars, MJ  Body mass index is 26.61 kg/m. - Patient is eligible for diabetes screening based on BMI and age >82?  not applicable HA1C ordered? not applicable  Patient reports 1  partner/s in last year. Desires STI screening?  No - declines bloodwork  Has patient been screened once for HCV in the past?  No  No results found for: "HCVAB"  Does the patient have current drug use (including MJ), have a partner with drug use, and/or has been incarcerated since last result? No  If yes-- Screen for HCV through [facility]   Does the  patient meet criteria for HBV testing? No  Criteria:  -Household, sexual or needle sharing contact with HBV -History of drug use -HIV positive -Those with known Hep C   Health Maintenance Due  Topic Date Due   COVID-19 Vaccine (1) Never done   HIV Screening  Never done   Hepatitis C Screening  Never done    Review of Systems  Constitutional:  Positive for weight loss (loss and gain).  Neurological:  Positive for headaches (2x/mo always occipital relieved with Advil).    The following portions of the patient's history were reviewed and updated as appropriate: allergies, current medications, past family history, past medical history, past social history, past surgical history and problem list. Problem list updated.   See flowsheet for other program required questions.  Objective:   Vitals:   09/19/21 1456  BP: 128/83  Pulse: 74  Temp: 98.1 F (36.7 C)  Weight: 162 lb 6.4 oz (73.7 kg)    Physical Exam Constitutional:      Appearance: Normal appearance. She is normal weight.  HENT:     Head: Normocephalic and atraumatic.     Mouth/Throat:     Mouth: Mucous membranes are moist.     Comments: Last dental exam 02/2021 Eyes:     Conjunctiva/sclera: Conjunctivae normal.  Neck:     Thyroid: No thyroid mass, thyromegaly or thyroid tenderness.      Comments: Healing scar from 08/30/21 bulging disc surgery at [facility] Cardiovascular:     Rate and Rhythm: Normal rate  and regular rhythm.  Pulmonary:     Effort: Pulmonary effort is normal.     Breath sounds: Normal breath sounds.  Abdominal:     Palpations: Abdomen is soft.     Comments: Soft without masses or tenderness, fair tone  Genitourinary:    General: Normal vulva.     Exam position: Lithotomy position.     Vagina: Vaginal discharge (white creamy leukorrhea, ph<4.5) present.     Cervix: Normal.     Uterus: Normal.      Adnexa: Right adnexa normal and left adnexa normal.     Rectum: Normal.  Musculoskeletal:         General: Normal range of motion.     Cervical back: Normal range of motion and neck supple.  Skin:    General: Skin is warm and dry.  Neurological:     Mental Status: She is alert.  Psychiatric:        Mood and Affect: Mood normal.      Assessment and Plan:  Rose Ray is a 36 y.o. female presenting to the [facility] for an initial annual wellness/contraceptive visit  Contraception counseling: Reviewed options based on patient desire and reproductive life plan. Patient is interested in Hormonal Injection. This was provided to the patient today.  if not why not clearly documented  Risks, benefits, and typical effectiveness rates were reviewed.  Questions were answered.  Written information was also given to the patient to review.    The patient will follow up in  11-13 weeks for surveillance.  The patient was told to call with any further questions, or with any concerns about this method of contraception.  Emphasized use of condoms 100% of the time for STI prevention.  Need for ECP was assessed. Patient reported not meeting criteria.  Reviewed options and patient desired No method of ECP, declined all    1. Family planning Treat wet mount per standing orders Immunization nurse consult  - WET PREP FOR TRICH, YEAST, CLUE - Chlamydia/Gonorrhea [facility] - medroxyPROGESTERone (DEPO-PROVERA) injection 150 mg  2. Encounter for surveillance of injectable contraceptive May have DMPA 150 mg IM q 11-13 wks x 1 year  3. Trichomonas infection 08/15/20      No follow-ups on file.  No future appointments.  Alberteen Spindle, CNM

## 2021-09-19 NOTE — Progress Notes (Signed)
C/o pain back of neck where had surgery August 30 2021 for "bulging disc". Patient desires to continue Depo injections.  Wet Prep WNL. Delynn Flavin RN

## 2021-09-20 LAB — WET PREP FOR TRICH, YEAST, CLUE
Trichomonas Exam: NEGATIVE
Yeast Exam: NEGATIVE

## 2021-09-28 ENCOUNTER — Telehealth: Payer: Self-pay

## 2021-09-28 NOTE — Telephone Encounter (Signed)
Calling pt regarding positive chlamydia result from 09/19/21 vaginal specimen. Pt needs tx appt. (Depo for [facility]).  Phone call to [phone]. Left message on voicemail that RN with [facility] is calling re TR. Please call Sybrina Laning at [phone].  Also sent MyChart message.

## 2021-10-01 NOTE — Telephone Encounter (Signed)
Phone call to pt at [phone]. Left message that RN with [facility] is calling re TR. Please call Karleigh Bunte at [phone].

## 2021-10-02 ENCOUNTER — Ambulatory Visit: Payer: Self-pay

## 2021-10-02 DIAGNOSIS — A749 Chlamydial infection, unspecified: Secondary | ICD-10-CM

## 2021-10-02 MED ORDER — DOXYCYCLINE HYCLATE 100 MG PO TABS: 100.0000 mg | ORAL_TABLET | Freq: Two times a day (BID) | ORAL | 0 refills | Status: AC

## 2021-10-02 NOTE — Telephone Encounter (Addendum)
Received phone call from pt and pt confirmed identity. Counseled pt re + CT result.  Pt states allergies to amoxicillin and meloxicam and confirms she is using depo for [facility].  Pt scheduled for 10/02/21 tx appt.

## 2021-10-02 NOTE — Progress Notes (Signed)
In nurse clinic for Chlamydia treatment.  See test results flow sheet/pt instructions.   The patient was dispensed Doxycycline 100 mg (#14) po bid x 7days.  I provided counseling today regarding the medication. We discussed the medication, the side effects and when to call clinic. Patient given the opportunity to ask questions. Questions answered.    To RTC in 3 months for chlamydia rescreen; appt reminder given to call for appt.    Cherlynn Polo, RN

## 2021-12-25 ENCOUNTER — Ambulatory Visit (LOCAL_COMMUNITY_HEALTH_CENTER): Payer: Self-pay

## 2021-12-25 VITALS — BP 130/71 | Ht 65.5 in | Wt 172.0 lb

## 2021-12-25 DIAGNOSIS — Z3042 Encounter for surveillance of injectable contraceptive: Secondary | ICD-10-CM

## 2021-12-25 DIAGNOSIS — Z3009 Encounter for other general counseling and advice on contraception: Secondary | ICD-10-CM

## 2021-12-25 NOTE — Progress Notes (Signed)
13 weeks 6 days post depo. Voices no concerns.Depo given today per order by Ola Spurr, CNM dated 09/19/2021. Tolerated well RUOQ. Next depo due 03/12/2022, pt aware. Josie Saunders, RN

## 2022-01-27 ENCOUNTER — Emergency Department
Admission: EM | Admit: 2022-01-27 | Discharge: 2022-01-27 | Disposition: A | Discharge status: Home / Self Care | Payer: Self-pay | Attending: Emergency Medicine | Admitting: Emergency Medicine

## 2022-01-27 ENCOUNTER — Encounter: Payer: Self-pay | Admitting: Emergency Medicine

## 2022-01-27 ENCOUNTER — Emergency Department: Payer: Self-pay

## 2022-01-27 DIAGNOSIS — M79671 Pain in right foot: Secondary | ICD-10-CM | POA: Insufficient documentation

## 2022-01-27 MED ORDER — PREDNISONE 10 MG PO TABS: ORAL_TABLET | ORAL | 0 refills | Status: DC

## 2022-01-27 NOTE — ED Provider Triage Note (Signed)
Emergency Medicine Provider Triage Evaluation Note  Rose Ray , a 36 y.o. female  was evaluated in triage.  Pt complains of pain and swelling in the right foot/ankle/calf x 2 weeks. No history of DVT. Reports having been standing on tip toes but no injury. No shortness of breath. No previous symptoms similar to today's complaint.  Physical Exam  BP 137/78   Pulse 66   Temp 98.2 F (36.8 C) (Oral)   Resp 18   SpO2 97%  Gen:   Awake, no distress   Resp:  Normal effort  MSK:   Moves extremities without difficulty  Other:    Medical Decision Making  Medically screening exam initiated at 1:01 PM.  Appropriate orders placed.  Rose Ray was informed that the remainder of the evaluation will be completed by another provider, this initial triage assessment does not replace that evaluation, and the importance of remaining in the ED until their evaluation is complete.  [place] for DVT ordered.   Chinita Pester, FNP 01/27/22 1303

## 2022-01-27 NOTE — Discharge Instructions (Signed)
Follow up with DR. Patel at [facility] if any continued problems.  Prednisone starting today  and for the next 6 days.

## 2022-01-27 NOTE — ED Triage Notes (Signed)
Patient to ED via POV for right foot pain. Pain goes from pinky toe up into calf. Pain for past two weeks. Swelling noted per patient. No known injury.

## 2022-01-27 NOTE — ED Provider Notes (Signed)
[facility] Provider Note    Event Date/Time   First MD Initiated Contact with Patient 01/27/22 1432     (approximate)   History   Foot Pain   HPI  Rose Ray is a 36 y.o. female   presents to the ED with complaint of right foot pain for the last 2 weeks.  Patient denies any recent injury to her foot but states that she gets up on her toes frequently because she is short and has to reach for things.  Patient has been taking ibuprofen without any relief.  Patient has been taking over-the-counter ibuprofen without any relief.      Physical Exam   Triage Vital Signs: ED Triage Vitals  Enc Vitals Group     BP 01/27/22 1259 137/78     Pulse Rate 01/27/22 1259 66     Resp 01/27/22 1259 18     Temp 01/27/22 1259 98.2 F (36.8 C)     Temp Source 01/27/22 1259 Oral     SpO2 01/27/22 1259 97 %     Weight --      Height --      Head Circumference --      Peak Flow --      Pain Score 01/27/22 1300 10     Pain Loc --      Pain Edu? --      Excl. in GC? --     Most recent vital signs: Vitals:   01/27/22 1259  BP: 137/78  Pulse: 66  Resp: 18  Temp: 98.2 F (36.8 C)  SpO2: 97%     General: Awake, no distress.  CV:  Good peripheral perfusion.  Resp:  Normal effort.  Abd:  No distention.  Other:  Examination of the right foot there is no gross deformity and no soft tissue edema is appreciated.  No pitting edema, discoloration, abrasions.  Capillary refills less than 3 seconds.  Generalized tenderness is noted on palpation of the fourth and fifth metatarsal area distally.  Range of motion is without restriction.   ED Results / Procedures / Treatments   Labs (all labs ordered are listed, but only abnormal results are displayed) Labs Reviewed - No data to display   RADIOLOGY  Venous ultrasound right lower extremity per radiologist was negative for DVT.   PROCEDURES:  Critical Care performed:   Procedures   MEDICATIONS  ORDERED IN ED: Medications - No data to display   IMPRESSION / MDM / ASSESSMENT AND PLAN / ED COURSE  I reviewed the triage vital signs and the nursing notes.   Differential diagnosis includes, but is not limited to, musculoskeletal pain, DVT, right foot strain.  36 year old female presents to the ED with complaint of right foot pain for the last 2 weeks without known history of injury.  Patient states that she frequently gets on her tiptoes to reach for things because she is short.  Patient has been taking ibuprofen without any relief.  Physical exam is benign.  Will discontinue the ibuprofen and a trial of prednisone was sent to the pharmacy.  She will follow-up with Dr. Allena Katz at [facility] if any continued problems.       Patient's presentation is most consistent with acute complicated illness / injury requiring diagnostic workup.  FINAL CLINICAL IMPRESSION(S) / ED DIAGNOSES   Final diagnoses:  Foot pain, right     Rx / DC Orders   ED Discharge Orders  Ordered    predniSONE (DELTASONE) 10 MG tablet        01/27/22 1438             Note:  This document was prepared using Dragon voice recognition software and may include unintentional dictation errors.   Tommi Rumps, PA-C 01/27/22 1509    Shaune Pollack, MD 02/02/22 [phone]

## 2022-03-13 ENCOUNTER — Ambulatory Visit (LOCAL_COMMUNITY_HEALTH_CENTER): Payer: BLUE CROSS/BLUE SHIELD

## 2022-03-13 VITALS — BP 130/83 | Ht 65.5 in | Wt 173.0 lb

## 2022-03-13 DIAGNOSIS — Z309 Encounter for contraceptive management, unspecified: Secondary | ICD-10-CM | POA: Diagnosis not present

## 2022-03-13 DIAGNOSIS — Z3042 Encounter for surveillance of injectable contraceptive: Secondary | ICD-10-CM

## 2022-03-13 DIAGNOSIS — Z3009 Encounter for other general counseling and advice on contraception: Secondary | ICD-10-CM

## 2022-03-13 NOTE — Progress Notes (Signed)
11 weeks 1 day post depo. Voices no concerns. Depo given today per order by Ola Spurr, CNM dated 09/19/2021. Tolerated well LUOQ. Next depo due 05/29/2022, has reminder. Josie Saunders, RN

## 2022-05-29 ENCOUNTER — Ambulatory Visit (LOCAL_COMMUNITY_HEALTH_CENTER): Payer: BLUE CROSS/BLUE SHIELD

## 2022-05-29 VITALS — BP 123/83 | Ht 65.0 in | Wt 172.0 lb

## 2022-05-29 DIAGNOSIS — Z30013 Encounter for initial prescription of injectable contraceptive: Secondary | ICD-10-CM

## 2022-05-29 DIAGNOSIS — Z308 Encounter for other contraceptive management: Secondary | ICD-10-CM

## 2022-05-29 DIAGNOSIS — Z3042 Encounter for surveillance of injectable contraceptive: Secondary | ICD-10-CM

## 2022-05-29 DIAGNOSIS — Z3009 Encounter for other general counseling and advice on contraception: Secondary | ICD-10-CM

## 2022-05-29 NOTE — Progress Notes (Signed)
11 weeks post depo.  Denies any problems associated with depo.  States having back pain and needing another back surgery.  Depo given IM RUOQ per order by E. Sciora CNM dated 09/19/21; pt tolerated well.  Next depo due 08/14/22; appt reminder given. Tonny Branch, RN

## 2022-08-16 ENCOUNTER — Ambulatory Visit (LOCAL_COMMUNITY_HEALTH_CENTER): Payer: BLUE CROSS/BLUE SHIELD

## 2022-08-16 VITALS — BP 122/76 | Wt 175.5 lb

## 2022-08-16 DIAGNOSIS — Z3042 Encounter for surveillance of injectable contraceptive: Secondary | ICD-10-CM

## 2022-08-16 DIAGNOSIS — Z3009 Encounter for other general counseling and advice on contraception: Secondary | ICD-10-CM

## 2022-08-16 DIAGNOSIS — Z30013 Encounter for initial prescription of injectable contraceptive: Secondary | ICD-10-CM

## 2022-08-16 DIAGNOSIS — Z308 Encounter for other contraceptive management: Secondary | ICD-10-CM

## 2022-08-16 MED ORDER — MEDROXYPROGESTERONE ACETATE 150 MG/ML IM SUSP
150.0000 mg | Freq: Once | INTRAMUSCULAR | Status: AC
  Administered 2022-08-16: 150 mg via INTRAMUSCULAR

## 2022-08-16 NOTE — Progress Notes (Signed)
11 weeks 2 days post depo. Voices no concerns. Depo given today per order by Hazle Coca, CNM dated 09/19/2021. Tolerated well in LUOQ. Next depo due 11/01/2022.   Abagail Kitchens, RN

## 2022-10-13 ENCOUNTER — Emergency Department
Admission: EM | Admit: 2022-10-13 | Discharge: 2022-10-13 | Disposition: A | Discharge status: Home / Self Care | Payer: BLUE CROSS/BLUE SHIELD | Source: Home / Self Care | Attending: Emergency Medicine | Admitting: Emergency Medicine

## 2022-10-13 ENCOUNTER — Other Ambulatory Visit: Payer: Self-pay

## 2022-10-13 DIAGNOSIS — M542 Cervicalgia: Secondary | ICD-10-CM | POA: Insufficient documentation

## 2022-10-13 DIAGNOSIS — M546 Pain in thoracic spine: Secondary | ICD-10-CM | POA: Insufficient documentation

## 2022-10-13 MED ORDER — CYCLOBENZAPRINE HCL 5 MG PO TABS: 5.0000 mg | ORAL_TABLET | Freq: Two times a day (BID) | ORAL | 0 refills | Status: DC | PRN

## 2022-10-13 MED ORDER — LIDOCAINE 5 % EX PTCH
1.0000 | MEDICATED_PATCH | CUTANEOUS | Status: DC
  Administered 2022-10-13: 1 via TRANSDERMAL
  Filled 2022-10-13: qty 1

## 2022-10-13 MED ORDER — LIDOCAINE 5 % EX PTCH: 1.0000 | MEDICATED_PATCH | Freq: Two times a day (BID) | CUTANEOUS | 0 refills | Status: AC

## 2022-10-13 MED ORDER — ACETAMINOPHEN 325 MG PO TABS
650.0000 mg | ORAL_TABLET | Freq: Once | ORAL | Status: AC
  Administered 2022-10-13: 650 mg via ORAL
  Filled 2022-10-13: qty 2

## 2022-10-13 NOTE — ED Triage Notes (Signed)
C/O upper back pain radiating down back.  Denies injury,pain started last night.  Neck surgery last year. "Disc replacements".

## 2022-10-13 NOTE — Discharge Instructions (Signed)
You did not wish to have any imaging today.  You may take the medications as prescribed but remember that the Flexeril make you sleepy so you should not drive, operate heavy machinery, or perform any test that require concentration while taking this medication.  Please follow-up with your surgeon.  Please return for any new, worsening, or change in symptoms or other concerns.

## 2022-10-13 NOTE — ED Provider Notes (Signed)
[facility] Provider Note    Event Date/Time   First MD Initiated Contact with Patient 10/13/22 1027     (approximate)   History   Back Pain   HPI  Rose Ray is a 37 y.o. female with a past medical history of intervertebral disc injury, who presents today for evaluation of neck and back pain.   Patient Active Problem List   Diagnosis Date Noted   Trichomonas infection 08/15/20 09/19/2021   Bulging of cervical intervertebral disc with surgery 08/30/21 09/19/2021   Migraines dx'd 2018 08/15/2020   Overweight with body mass index BMI=27.5 09/12/2017          Physical Exam   Triage Vital Signs: ED Triage Vitals  Encounter Vitals Group     BP 10/13/22 0939 122/74     Systolic BP Percentile --      Diastolic BP Percentile --      Pulse Rate 10/13/22 0939 61     Resp 10/13/22 0939 16     Temp 10/13/22 0939 98.2 F (36.8 C)     Temp Source 10/13/22 0939 Oral     SpO2 10/13/22 0939 98 %     Weight 10/13/22 0931 175 lb 7.8 oz (79.6 kg)     Height 10/13/22 0931 5\' 5"  (1.651 m)     Head Circumference --      Peak Flow --      Pain Score 10/13/22 0931 10     Pain Loc --      Pain Education --      Exclude from Growth Chart --     Most recent vital signs: Vitals:   10/13/22 0939  BP: 122/74  Pulse: 61  Resp: 16  Temp: 98.2 F (36.8 C)  SpO2: 98%    Physical Exam Vitals and nursing note reviewed.  Constitutional:      General: Awake and alert. No acute distress.    Appearance: Normal appearance. The patient is normal weight.  HENT:     Head: Normocephalic and atraumatic.     Mouth: Mucous membranes are moist.  Eyes:     General: PERRL. Normal EOMs        Right eye: No discharge.        Left eye: No discharge.     Conjunctiva/sclera: Conjunctivae normal.  Cardiovascular:     Rate and Rhythm: Normal rate and regular rhythm.     Pulses: Normal pulses.     Heart sounds: Normal heart sounds Pulmonary:     Effort: Pulmonary  effort is normal. No respiratory distress.     Breath sounds: Normal breath sounds.  Abdominal:     Abdomen is soft. There is no abdominal tenderness. No rebound or guarding. No distention. Musculoskeletal:        General: No swelling. Normal range of motion.     Cervical back: Normal range of motion and neck supple. No midline cervical spine tenderness.  Mild paraspinal muscle tenderness without overlying skin changes, no swelling. Full range of motion of neck.  Negative Spurling test.  Negative Lhermitte sign.  Normal strength and sensation in bilateral upper extremities. Normal grip strength bilaterally.  Normal intrinsic muscle function of the hand bilaterally.  Normal radial pulses bilaterally. Back: No midline tenderness. Strength and sensation 5/5 to bilateral lower extremities. Normal great toe extension against resistance. Normal sensation throughout feet. Normal patellar reflexes. Negative SLR and opposite SLR bilaterally. Negative FABER test Skin:    General: Skin is  warm and dry.     Capillary Refill: Capillary refill takes less than 2 seconds.     Findings: No rash.  Neurological:     Mental Status: The patient is awake and alert.      ED Results / Procedures / Treatments   Labs (all labs ordered are listed, but only abnormal results are displayed) Labs Reviewed - No data to display   EKG     RADIOLOGY     PROCEDURES:  Critical Care performed:   Procedures   MEDICATIONS ORDERED IN ED: Medications  lidocaine (LIDODERM) 5 % 1 patch (1 patch Transdermal Patch Applied 10/13/22 1048)  acetaminophen (TYLENOL) tablet 650 mg (650 mg Oral Given 10/13/22 1049)     IMPRESSION / MDM / ASSESSMENT AND PLAN / ED COURSE  I reviewed the triage vital signs and the nursing notes.   Differential diagnosis includes, but is not limited to, acute on chronic pain, cervicalgia, muscle spasm, muscle strain.  Patient has a long history of neck/back pain who presents with  neck/back pain that feels the same as her previous episodes.  She has 5 out of 5 strength with intact sensation to extensor hallucis dorsiflexion and plantarflexion of bilateral lower extremities with normal patellar reflexes bilaterally.  She has normal strength and sensation bilateral upper extremities, normal grip strength bilaterally, normal intrinsic muscle function of her hands bilaterally, do not suspect cervical radiculopathy or central cord syndrome at this time.  Most likely etiology at this point is muscle strain vs spasm. No red flags to indicate patient is at risk for more auspicious process that would require urgent/emergent spinal imaging or subspecialty evaluation at this time. She was offered imaging given her previous surgery but she declined. No major trauma, no midline tenderness, no history or physical exam findings to suggest cauda equina syndrome or spinal cord compression. No focal neurological deficits on exam. No constitutional symptoms or history of immunosuppression or IVDA to suggest potential for epidural abscess. Not anticoagulated, no history of bleeding diastasis to suggest risk for epidural hematoma. No chronic steroid use or advanced age or history of malignancy to suggest proclivity towards pathological fracture.  No abdominal pain or flank pain to suggest kidney stone, no history of kidney stone.  No fever or dysuria or CVAT to suggest pyelonephritis.  No chest pain, back pain, shortness of breath, neurological deficits, to suggest vascular catastrophe, and pulses are equal in all 4 extremities.    Patient was treated in the emergency department with Tylenol and Lidoderm patch given that she is driving.  She was given a prescription for Flexeril which has helped her in the past, though reminded that this may make her sleepy and she should not drive, operate heavy machinery, or perform any task that require concentration will take this medication.    Discussed care  instructions and return precautions with patient. Recommended close outpatient follow-up with her specialist for re-evaluation. Patient agrees with plan of care. Will treat the patient symptomatically as needed for pain control. Will discharge patient to take these medications and return for any worsening or different pain or development of any neurologic symptoms. Educated patient regarding expected time course for back pain to improve and recommended very close outpatient follow-up. Patient understands and agrees with plan.   Patient's presentation is most consistent with exacerbation of chronic illness.    FINAL CLINICAL IMPRESSION(S) / ED DIAGNOSES   Final diagnoses:  Cervicalgia     Rx / DC Orders   ED Discharge  Orders          Ordered    cyclobenzaprine (FLEXERIL) 5 MG tablet  2 times daily PRN        10/13/22 1049    lidocaine (LIDODERM) 5 %  Every 12 hours        10/13/22 1049             Note:  This document was prepared using Dragon voice recognition software and may include unintentional dictation errors.   Keturah Shavers 10/13/22 1110    Sharman Cheek, MD 10/13/22 [phone]

## 2022-11-08 ENCOUNTER — Ambulatory Visit (LOCAL_COMMUNITY_HEALTH_CENTER): Payer: BLUE CROSS/BLUE SHIELD

## 2022-11-08 VITALS — BP 136/68 | Ht 65.0 in | Wt 168.5 lb

## 2022-11-08 DIAGNOSIS — Z3042 Encounter for surveillance of injectable contraceptive: Secondary | ICD-10-CM

## 2022-11-08 DIAGNOSIS — Z308 Encounter for other contraceptive management: Secondary | ICD-10-CM | POA: Diagnosis not present

## 2022-11-08 DIAGNOSIS — Z30013 Encounter for initial prescription of injectable contraceptive: Secondary | ICD-10-CM | POA: Diagnosis not present

## 2022-11-08 DIAGNOSIS — Z3009 Encounter for other general counseling and advice on contraception: Secondary | ICD-10-CM

## 2022-11-08 MED ORDER — MEDROXYPROGESTERONE ACETATE 150 MG/ML IM SUSP
150.0000 mg | Freq: Once | INTRAMUSCULAR | Status: AC
Start: 2022-11-08 — End: 2022-11-08
  Administered 2022-11-08: 150 mg via INTRAMUSCULAR

## 2022-11-08 NOTE — Progress Notes (Signed)
12w 0d post depo. Voices no concerns. Depo given today per standing order by C. Larita Fife, MD. Tolerated well in RUOQ. Patient scheduled for physical 11/28/2022; has reminder. Next depo due 01/24/2023; patient aware.   Abagail Kitchens, RN

## 2022-11-28 ENCOUNTER — Ambulatory Visit: Payer: BLUE CROSS/BLUE SHIELD

## 2023-02-04 ENCOUNTER — Encounter: Payer: Self-pay | Admitting: Advanced Practice Midwife

## 2023-02-04 ENCOUNTER — Ambulatory Visit: Payer: BLUE CROSS/BLUE SHIELD | Admitting: Advanced Practice Midwife

## 2023-02-04 ENCOUNTER — Ambulatory Visit: Payer: BLUE CROSS/BLUE SHIELD

## 2023-02-04 VITALS — BP 119/72 | Ht 65.5 in | Wt 170.4 lb

## 2023-02-04 DIAGNOSIS — Z30013 Encounter for initial prescription of injectable contraceptive: Secondary | ICD-10-CM | POA: Diagnosis not present

## 2023-02-04 DIAGNOSIS — Z308 Encounter for other contraceptive management: Secondary | ICD-10-CM

## 2023-02-04 DIAGNOSIS — Z3009 Encounter for other general counseling and advice on contraception: Secondary | ICD-10-CM

## 2023-02-04 DIAGNOSIS — Z3042 Encounter for surveillance of injectable contraceptive: Secondary | ICD-10-CM

## 2023-02-04 MED ORDER — MEDROXYPROGESTERONE ACETATE 150 MG/ML IM SUSP
150.0000 mg | INTRAMUSCULAR | Status: AC
Start: 2023-02-04 — End: 2023-10-03
  Administered 2023-02-04 – 2023-10-03 (×4): 150 mg via INTRAMUSCULAR

## 2023-02-04 NOTE — Progress Notes (Signed)
[facility] DEPARTMENT [facility] [address] Main Number: [phone]   Family Planning Visit- Initial Visit  Subjective:  Rose Ray is a 37 y.o. SWF exsmoker  [phone] (39 yo daughter)  being seen today for an initial annual visit and to discuss reproductive life planning.  The patient is currently using Hormonal Injection for pregnancy prevention. Patient reports   does not want a pregnancy in the next year.     report they are looking for a method that provides High efficacy at preventing pregnancy  Patient has the following medical conditions has Overweight with body mass index BMI=27.5; Migraines dx'd 2018; Trichomonas infection 08/15/20; and Bulging of cervical intervertebral disc with surgery 08/30/21 on their problem list.  Chief Complaint  Patient presents with   Annual Exam   Contraception    Patient reports here for physical and more DMPA. Last DMPA 11/08/22. Last sex 01/18/23 without condom; with current partner x 15 years; 1 partner in last 3 mo. No menses on DMPA. Working 40 hrs/wk. Living alone. Last cig age 37. Last vaped age 37. Last ETOH 07/2021 (2 Smir[place]ff). Last dental exam 2023. Highest grade completed 12th. Last PE 09/19/21. Last CBE 08/15/20. Last Pap 08/15/20 neg HPV-  Patient denies cigar, MJ  Body mass index is 27.92 kg/m. - Patient is eligible for diabetes screening based on BMI> 25 and age >35?  Pt refusing HA1C ordered? Pt refusing  Patient reports 1  partner/s in last year. Desires STI screening?  No - declines exam or bloodwork  Has patient been screened once for HCV in the past?  Yes  No results found for: "HCVAB"  Does the patient have current drug use (including MJ), have a partner with drug use, and/or has been incarcerated since last result? No  If yes-- Screen for HCV through [facility]   Does the patient meet criteria for HBV testing? Yes  Criteria:  -Household, sexual or needle sharing contact  with HBV -History of drug use -HIV positive -Those with known Hep C   Health Maintenance Due  Topic Date Due   HIV Screening  Never done   Hepatitis C Screening  Never done   INFLUENZA VACCINE  Never done   COVID-19 Vaccine (1 - 2023-24 season) Never done    Review of Systems  All other systems reviewed and are negative.   The following portions of the patient's history were reviewed and updated as appropriate: allergies, current medications, past family history, past medical history, past social history, past surgical history and problem list. Problem list updated.   See flowsheet for other program required questions.  Objective:   Vitals:   02/04/23 1423  BP: 119/72  Weight: 170 lb 6.4 oz (77.3 kg)  Height: 5' 5.5" (1.664 m)    Physical Exam Constitutional:      Appearance: Normal appearance. She is normal weight.  HENT:     Head: Normocephalic and atraumatic.     Mouth/Throat:     Mouth: Mucous membranes are moist.     Comments: Caries; last dental exam 2023 Eyes:     Conjunctiva/sclera: Conjunctivae normal.  Neck:     Thyroid: No thyroid mass, thyromegaly or thyroid tenderness.  Cardiovascular:     Rate and Rhythm: Normal rate and regular rhythm.  Pulmonary:     Effort: Pulmonary effort is normal.     Breath sounds: Normal breath sounds.  Abdominal:     Palpations: Abdomen is soft.  Comments: Soft without masses or tenderness  Genitourinary:    Pubic Area: No pubic lice.      Comments: Pt declines pelvic exam, pap not needed Musculoskeletal:        General: Normal range of motion.     Cervical back: Normal range of motion and neck supple.  Skin:    General: Skin is warm and dry.  Neurological:     Mental Status: She is alert.  Psychiatric:        Mood and Affect: Mood normal.       Assessment and Plan:  Benji Caballeros is a 37 y.o. female presenting to the [facility] Department for an initial annual wellness/contraceptive  visit  Contraception counseling: Reviewed options based on patient desire and reproductive life plan. Patient is interested in Hormonal Injection. This was provided to the patient today.  if not why not clearly documented  Risks, benefits, and typical effectiveness rates were reviewed.  Questions were answered.  Written information was also given to the patient to review.    The patient will follow up in  11-13 weeks for surveillance.  The patient was told to call with any further questions, or with any concerns about this method of contraception.  Emphasized use of condoms 100% of the time for STI prevention.  Educated on ECP and assessed for need of ECP. Patient reported no unprotected self.  Reviewed options and patient desired No method of ECP, declined all    1. Family planning Please give pt dental list Immunization nurse consult  - medroxyPROGESTERone (DEPO-PROVERA) injection 150 mg  2. Encounter for surveillance of injectable contraceptive May have DMPA 150 mg IM q 11-13 wks x 1 year   Return for 11-13 wk DMPA.  No future appointments.  Alberteen Spindle, CNM

## 2023-02-04 NOTE — Progress Notes (Signed)
Depo given and next Depo reminder card given.Burt Knack, RN

## 2023-02-04 NOTE — Progress Notes (Signed)
Patient here for PE and Depo. Last PE was 09/19/2021, and last Depo was 11/08/22, 12 4/7 since last Depo.Burt Knack, RN

## 2023-04-30 ENCOUNTER — Ambulatory Visit: Payer: BLUE CROSS/BLUE SHIELD

## 2023-04-30 VITALS — BP 138/78 | Ht 65.51 in | Wt 167.5 lb

## 2023-04-30 DIAGNOSIS — Z3042 Encounter for surveillance of injectable contraceptive: Secondary | ICD-10-CM

## 2023-04-30 DIAGNOSIS — Z3009 Encounter for other general counseling and advice on contraception: Secondary | ICD-10-CM

## 2023-04-30 DIAGNOSIS — Z308 Encounter for other contraceptive management: Secondary | ICD-10-CM | POA: Diagnosis not present

## 2023-04-30 DIAGNOSIS — Z30013 Encounter for initial prescription of injectable contraceptive: Secondary | ICD-10-CM | POA: Diagnosis not present

## 2023-04-30 NOTE — Progress Notes (Signed)
 12w 1d post depo. Voices no concerns. Depo given today per order by Hazle Coca, CNM dated 02/04/2023. Tolerated well in RUOQ. Next depo due 07/16/2023; patient has reminder.   Abagail Kitchens, RN

## 2023-07-17 ENCOUNTER — Ambulatory Visit

## 2023-07-17 VITALS — BP 120/69 | Ht 65.51 in | Wt 172.0 lb

## 2023-07-17 DIAGNOSIS — Z308 Encounter for other contraceptive management: Secondary | ICD-10-CM

## 2023-07-17 DIAGNOSIS — Z3042 Encounter for surveillance of injectable contraceptive: Secondary | ICD-10-CM

## 2023-07-17 DIAGNOSIS — Z3009 Encounter for other general counseling and advice on contraception: Secondary | ICD-10-CM

## 2023-07-17 DIAGNOSIS — Z30013 Encounter for initial prescription of injectable contraceptive: Secondary | ICD-10-CM

## 2023-07-17 NOTE — Progress Notes (Signed)
 11w 1d post depo. Voices no concerns. Depo given today per order by Azzie Bollman, CNM dated 02/04/2023. Tolerated well in LUOQ. Next depo due 10/02/2023; patient aware.  Clare Critchley, RN

## 2023-10-03 ENCOUNTER — Ambulatory Visit

## 2023-10-03 VITALS — BP 127/81 | Ht 65.51 in | Wt 175.0 lb

## 2023-10-03 DIAGNOSIS — Z3009 Encounter for other general counseling and advice on contraception: Secondary | ICD-10-CM

## 2023-10-03 DIAGNOSIS — Z30013 Encounter for initial prescription of injectable contraceptive: Secondary | ICD-10-CM

## 2023-10-03 DIAGNOSIS — Z3042 Encounter for surveillance of injectable contraceptive: Secondary | ICD-10-CM

## 2023-10-03 NOTE — Progress Notes (Signed)
 11 weeks and 1 day post depo. Voices no concerns. Depo given today per order by FORBES Hillier, CNM dated 02/04/2023. Tolerated well in RUOQ. Next depo due 12/19/2023; patient aware.  Doyce CINDERELLA Shuck, RN

## 2023-12-19 ENCOUNTER — Ambulatory Visit

## 2023-12-19 VITALS — BP 137/80 | Wt 167.5 lb

## 2023-12-19 DIAGNOSIS — Z3042 Encounter for surveillance of injectable contraceptive: Secondary | ICD-10-CM

## 2023-12-19 DIAGNOSIS — Z30013 Encounter for initial prescription of injectable contraceptive: Secondary | ICD-10-CM

## 2023-12-19 DIAGNOSIS — Z3009 Encounter for other general counseling and advice on contraception: Secondary | ICD-10-CM

## 2023-12-19 MED ORDER — MEDROXYPROGESTERONE ACETATE 150 MG/ML IM SUSP
150.0000 mg | Freq: Once | INTRAMUSCULAR | Status: AC
Start: 2023-12-19 — End: 2023-12-19
  Administered 2023-12-19: 150 mg via INTRAMUSCULAR

## 2023-12-19 NOTE — Progress Notes (Signed)
 11weeks 0days post last depo. Voices no concerns. Depo given IM LUOQ per S.O .   Next depo & annual exam : !/9/25

## 2023-12-27 ENCOUNTER — Other Ambulatory Visit: Payer: Self-pay

## 2023-12-27 ENCOUNTER — Emergency Department

## 2023-12-27 ENCOUNTER — Emergency Department
Admission: EM | Admit: 2023-12-27 | Discharge: 2023-12-27 | Disposition: A | Discharge status: Home / Self Care | Attending: Emergency Medicine | Admitting: Emergency Medicine

## 2023-12-27 DIAGNOSIS — S20312A Abrasion of left front wall of thorax, initial encounter: Secondary | ICD-10-CM | POA: Diagnosis not present

## 2023-12-27 DIAGNOSIS — S39012A Strain of muscle, fascia and tendon of lower back, initial encounter: Secondary | ICD-10-CM | POA: Diagnosis not present

## 2023-12-27 DIAGNOSIS — Y92410 Unspecified street and highway as the place of occurrence of the external cause: Secondary | ICD-10-CM | POA: Insufficient documentation

## 2023-12-27 DIAGNOSIS — S3992XA Unspecified injury of lower back, initial encounter: Secondary | ICD-10-CM | POA: Diagnosis present

## 2023-12-27 LAB — POC URINE PREG, ED: Preg Test, Ur: NEGATIVE

## 2023-12-27 NOTE — ED Notes (Signed)
 Pt completely assessed by EDP and set to discharge. No RN assessment performed.  Pt provided discharge instructions and prescription information. Pt was given the opportunity to ask questions and questions were answered.
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
## 2024-03-10 ENCOUNTER — Ambulatory Visit

## 2024-03-10 VITALS — BP 127/84 | HR 80 | Ht 65.0 in | Wt 162.8 lb

## 2024-03-10 DIAGNOSIS — Z3042 Encounter for surveillance of injectable contraceptive: Secondary | ICD-10-CM

## 2024-03-10 DIAGNOSIS — Z3009 Encounter for other general counseling and advice on contraception: Secondary | ICD-10-CM

## 2024-03-10 DIAGNOSIS — Z1231 Encounter for screening mammogram for malignant neoplasm of breast: Secondary | ICD-10-CM

## 2024-03-10 DIAGNOSIS — Z30013 Encounter for initial prescription of injectable contraceptive: Secondary | ICD-10-CM

## 2024-03-10 MED ORDER — MEDROXYPROGESTERONE ACETATE 150 MG/ML IM SUSP
150.0000 mg | INTRAMUSCULAR | Status: AC
  Administered 2024-03-10: 150 mg via INTRAMUSCULAR

## 2024-03-10 NOTE — Progress Notes (Signed)
 " [facility] DEPARTMENT [facility] [address] [place] [address] Main phone: [phone]  Family Planning Visit - Repeat Yearly Visit  Subjective:  Rose Ray is a 39 y.o. [id number]  being seen today for an annual wellness visit and to discuss contraception options. The patient is currently using hormonal injection for pregnancy prevention. Patient does not want a pregnancy in the next year.   Patient reports they are looking to continue Depo Provera .  Patient has the following medical problems:  Patient Active Problem List   Diagnosis Date Noted   Trichomonas infection 08/15/20 09/19/2021   Bulging of cervical intervertebral disc with surgery 08/30/21 09/19/2021   Migraines dx'd 2018 08/15/2020   Overweight with body mass index BMI=27.5 09/12/2017   Chief Complaint  Patient presents with   Annual Exam    PE/Depo   HPI Patient reports desire for Depo Provera  continuation. Been on Depo since 2004. Has a 41 year old. Does some exercise, discussed weight bearing exercise and calcium in diet.  Patient denies other concerns.  Review of Systems  All other systems reviewed and are negative.  See flowsheet for further details and programmatic requirements Hyperlink available at the top of the signed note in blue.  Flow sheet content below:  Pregnancy Intention Screening Does the patient want to become pregnant in the next year?: No Does the patient's partner want to become pregnant in the next year?: N/A Would the patient like to discuss contraceptive options today?: No Sexual History What age did you start your period?: (P)  (pt do not recall) How often do you have your period?: (P) no periods Date of last sex?: (P) 03/06/24 Has the patient had unprotected sex within the last 5 days?: (P) No Do you have sex with men, women, both men and women?: (P) Men only In the past 2 months how many partners have you had sex with?: (P)  1 In the past 12 months, how many partners have you had sex with?: (P) 1 Is it possible that any of your sex partners in the past 12 months had sex with someone else whild they were still in a sexual relationship with you?: (P) No What ways do you have sex?: (P) Vaginal Do you or your partner use condoms and/or dental dams every time you have vaginal, oral or anal sex?: (P) No Do you douche?: (P) No Date of last HIV test?: (P)  (none) Have you ever had an STD?: (P) Yes Have you or your partner ever shot up drugs?: (P) No Have any of your partners used drugs in the past?: (P) No Have you or your partners exchanged money or drugs for sex?: (P) No  Diabetes screening This patient is 39 y.o. with a BMI of Body mass index is 27.09 kg/m.Rose Ray  Is patient eligible for diabetes screening (age >35 and BMI >25)?  yes  Was Hgb A1c ordered? Declines  STI screening Patient reports 1 of partners in last year.  Does this patient desire STI screening?  No - declines  Cervical Cancer Screening   Due June, 2027 Result Date Procedure Results Follow-ups  08/15/2020 IGP, Aptima HPV DIAGNOSIS:: Comment Specimen adequacy:: Comment Clinician Provided ICD10: Comment Performed by:: Comment PAP Smear Comment: . Note:: Comment Test Methodology: Comment HPV Aptima: Negative   06/12/2015 HM PAP SMEAR HM Pap smear: Neg/ Neg HR HPV     Health Maintenance Due  Topic Date Due   HIV Screening  Never  done   Hepatitis C Screening  Never done   Hepatitis B Vaccines 19-59 Average Risk (1 of 3 - 19+ 3-dose series) Never done   HPV VACCINES (1 - Risk 3-dose SCDM series) Never done   Influenza Vaccine  Never done   COVID-19 Vaccine (1 - 2025-26 season) Never done   Mammogram  01/25/2024   The following portions of the patient's history were reviewed and updated as appropriate: allergies, current medications, past family history, past medical history, past social history, past surgical history and problem list.  Problem list updated.  Objective:   Vitals:   03/10/24 1509  BP: 127/84  Pulse: 80  Weight: 162 lb 12.8 oz (73.8 kg)  Height: 5' 5 (1.651 m)   Physical Exam Vitals and nursing note reviewed.  Constitutional:      Appearance: Normal appearance.  HENT:     Head: Normocephalic and atraumatic.     Comments: No nits or hair loss on scalp, brows, and lashes    Mouth/Throat:     Mouth: Mucous membranes are moist.     Pharynx: Oropharynx is clear. No oropharyngeal exudate or posterior oropharyngeal erythema.  Eyes:     General:        Right eye: No discharge.        Left eye: No discharge.     Conjunctiva/sclera: Conjunctivae normal.     Right eye: Right conjunctiva is not injected.     Left eye: Left conjunctiva is not injected.  Cardiovascular:     Heart sounds: Normal heart sounds, S1 normal and S2 normal.  Pulmonary:     Effort: Pulmonary effort is normal.     Breath sounds: Normal breath sounds.  Chest:     Comments: CBE declined Abdominal:     General: Abdomen is flat.     Palpations: Abdomen is soft.     Tenderness: There is no abdominal tenderness. There is no rebound.  Genitourinary:    Comments: Politely declined genital exam. Musculoskeletal:     Right lower leg: No edema.     Left lower leg: No edema.  Lymphadenopathy:     Cervical: No cervical adenopathy.     Upper Body:     Right upper body: No supraclavicular, axillary or epitrochlear adenopathy.     Left upper body: No supraclavicular, axillary or epitrochlear adenopathy.     Comments: Patient declines pelvic exam, inguinal lymph nodes not evaluated.  Skin:    General: Skin is warm and dry.     Findings: No lesion or rash.  Neurological:     Mental Status: She is alert and oriented to person, place, and time.  Psychiatric:        Mood and Affect: Mood normal.    Assessment and Plan:  Rose Ray is a 39 y.o. female [id number] presenting to the [facility] Department for an yearly  wellness and contraception visit  Family planning  Contraception counseling:  Reviewed options based on patient desire and reproductive life plan. Patient is interested in Hormonal Injection. This was provided to the patient today.  Risks, benefits, and typical effectiveness rates were reviewed.  Questions were answered.  Written information was also given to the patient to review.    The patient will follow up in  3 months for surveillance.  The patient was told to call with any further questions, or with any concerns about this method of contraception.  Emphasized use of condoms 100% of the time for STI prevention.  Emergency Contraception Precautions (ECP): Patient assessed for need of ECP. She is not a candidate based on depo provera  within recommended dates.   2. Encounter for screening mammogram for malignant neoplasm of breast  - MM 3D SCREENING MAMMOGRAM BILATERAL BREAST; Future  3. Encounter for surveillance of injectable contraceptive  - medroxyPROGESTERone  (DEPO-PROVERA ) injection 150 mg   Return in about 11 weeks (around 05/26/2024).  No future appointments.  Damien FORBES Satchel, NP "

## 2024-03-10 NOTE — Progress Notes (Signed)
 Pt is here PE and Depo. Depo IM injection given to pt at the LUOQ and pt tolerated well to the injection with no complications. Reminder card given. Opportunity given to Patient to ask questions for any clarification. Questions answered. Wilkie Drought, RN.
